# Patient Record
Sex: Female | Born: 1955
Health system: Southern US, Community
[De-identification: ages and names within clinical notes are randomized; demographics above are authoritative.]

## PROBLEM LIST (undated history)

## (undated) DIAGNOSIS — Z8719 Personal history of other diseases of the digestive system: Secondary | ICD-10-CM

## (undated) DIAGNOSIS — H3530 Unspecified macular degeneration: Secondary | ICD-10-CM

## (undated) DIAGNOSIS — Z780 Asymptomatic menopausal state: Secondary | ICD-10-CM

## (undated) DIAGNOSIS — J45909 Unspecified asthma, uncomplicated: Secondary | ICD-10-CM

## (undated) DIAGNOSIS — J302 Other seasonal allergic rhinitis: Secondary | ICD-10-CM

## (undated) DIAGNOSIS — H269 Unspecified cataract: Secondary | ICD-10-CM

## (undated) DIAGNOSIS — T7840XA Allergy, unspecified, initial encounter: Secondary | ICD-10-CM

## (undated) DIAGNOSIS — M1990 Unspecified osteoarthritis, unspecified site: Secondary | ICD-10-CM

## (undated) DIAGNOSIS — R7989 Other specified abnormal findings of blood chemistry: Secondary | ICD-10-CM

## (undated) DIAGNOSIS — R1011 Right upper quadrant pain: Secondary | ICD-10-CM

## (undated) DIAGNOSIS — E079 Disorder of thyroid, unspecified: Secondary | ICD-10-CM

## (undated) DIAGNOSIS — R000 Tachycardia, unspecified: Secondary | ICD-10-CM

## (undated) DIAGNOSIS — F419 Anxiety disorder, unspecified: Secondary | ICD-10-CM

## (undated) HISTORY — DX: Right upper quadrant pain: R10.11

## (undated) HISTORY — DX: Other specified abnormal findings of blood chemistry: R79.89

## (undated) HISTORY — DX: Unspecified osteoarthritis, unspecified site: M19.90

## (undated) HISTORY — DX: Unspecified macular degeneration: H35.30

## (undated) HISTORY — DX: Asymptomatic menopausal state: Z78.0

## (undated) HISTORY — DX: Disorder of thyroid, unspecified: E07.9

## (undated) HISTORY — DX: Tachycardia, unspecified: R00.0

## (undated) HISTORY — DX: Other seasonal allergic rhinitis: J30.2

## (undated) HISTORY — DX: Unspecified cataract: H26.9

## (undated) HISTORY — DX: Personal history of other diseases of the digestive system: Z87.19

## (undated) HISTORY — DX: Allergy, unspecified, initial encounter: T78.40XA

## (undated) HISTORY — DX: Anxiety disorder, unspecified: F41.9

## (undated) HISTORY — DX: Unspecified asthma, uncomplicated: J45.909

## (undated) HISTORY — PX: SKIN LESION EXCISION: SHX2412

## (undated) HISTORY — PX: COLONOSCOPY: SHX174

---

## 1979-04-20 HISTORY — PX: OTHER SURGICAL HISTORY: SHX169

## 1997-09-20 ENCOUNTER — Other Ambulatory Visit: Admission: RE | Admit: 1997-09-20 | Discharge: 1997-09-20 | Payer: Self-pay | Admitting: Gynecology

## 1997-10-01 ENCOUNTER — Emergency Department (HOSPITAL_COMMUNITY): Admission: EM | Admit: 1997-10-01 | Discharge: 1997-10-01 | Payer: Self-pay | Admitting: Emergency Medicine

## 1997-10-15 ENCOUNTER — Other Ambulatory Visit: Admission: RE | Admit: 1997-10-15 | Discharge: 1997-10-15 | Payer: Self-pay | Admitting: Gynecology

## 1998-04-19 HISTORY — PX: OTHER SURGICAL HISTORY: SHX169

## 1999-07-14 ENCOUNTER — Other Ambulatory Visit: Admission: RE | Admit: 1999-07-14 | Discharge: 1999-07-14 | Payer: Self-pay | Admitting: Gynecology

## 1999-10-27 ENCOUNTER — Other Ambulatory Visit: Admission: RE | Admit: 1999-10-27 | Discharge: 1999-10-27 | Payer: Self-pay | Admitting: Gynecology

## 1999-10-28 ENCOUNTER — Other Ambulatory Visit: Admission: RE | Admit: 1999-10-28 | Discharge: 1999-10-28 | Payer: Self-pay | Admitting: Gynecology

## 2000-01-09 ENCOUNTER — Emergency Department (HOSPITAL_COMMUNITY): Admission: EM | Admit: 2000-01-09 | Discharge: 2000-01-09 | Payer: Self-pay | Admitting: *Deleted

## 2000-08-01 ENCOUNTER — Other Ambulatory Visit: Admission: RE | Admit: 2000-08-01 | Discharge: 2000-08-01 | Payer: Self-pay | Admitting: Gynecology

## 2001-07-25 ENCOUNTER — Other Ambulatory Visit: Admission: RE | Admit: 2001-07-25 | Discharge: 2001-07-25 | Payer: Self-pay | Admitting: Gynecology

## 2002-02-13 ENCOUNTER — Other Ambulatory Visit: Admission: RE | Admit: 2002-02-13 | Discharge: 2002-02-13 | Payer: Self-pay | Admitting: Gynecology

## 2002-08-08 ENCOUNTER — Other Ambulatory Visit: Admission: RE | Admit: 2002-08-08 | Discharge: 2002-08-08 | Payer: Self-pay | Admitting: Gynecology

## 2003-08-12 ENCOUNTER — Other Ambulatory Visit: Admission: RE | Admit: 2003-08-12 | Discharge: 2003-08-12 | Payer: Self-pay | Admitting: Gynecology

## 2003-12-29 ENCOUNTER — Emergency Department (HOSPITAL_COMMUNITY): Admission: EM | Admit: 2003-12-29 | Discharge: 2003-12-30 | Payer: Self-pay | Admitting: Emergency Medicine

## 2004-09-01 ENCOUNTER — Other Ambulatory Visit: Admission: RE | Admit: 2004-09-01 | Discharge: 2004-09-01 | Payer: Self-pay | Admitting: Gynecology

## 2006-04-21 ENCOUNTER — Other Ambulatory Visit: Admission: RE | Admit: 2006-04-21 | Discharge: 2006-04-21 | Payer: Self-pay | Admitting: Gynecology

## 2006-09-09 ENCOUNTER — Ambulatory Visit: Payer: Self-pay | Admitting: Internal Medicine

## 2006-09-09 LAB — CONVERTED CEMR LAB
Alkaline Phosphatase: 81 units/L (ref 39–117)
BUN: 10 mg/dL (ref 6–23)
Calcium: 9.2 mg/dL (ref 8.4–10.5)
Chloride: 105 meq/L (ref 96–112)
Cholesterol: 217 mg/dL (ref 0–200)
Creatinine, Ser: 0.9 mg/dL (ref 0.4–1.2)
Direct LDL: 108.9 mg/dL
Free T4: 0.6 ng/dL (ref 0.6–1.6)
GFR calc Af Amer: 85 mL/min
GFR calc non Af Amer: 70 mL/min
Sodium: 141 meq/L (ref 135–145)
TSH: 2.96 microintl units/mL (ref 0.35–5.50)
Total Protein: 7.1 g/dL (ref 6.0–8.3)

## 2006-10-18 ENCOUNTER — Ambulatory Visit: Payer: Self-pay | Admitting: Internal Medicine

## 2006-11-01 ENCOUNTER — Encounter: Payer: Self-pay | Admitting: Physician Assistant

## 2006-11-01 ENCOUNTER — Ambulatory Visit: Payer: Self-pay | Admitting: Internal Medicine

## 2006-11-01 ENCOUNTER — Encounter: Payer: Self-pay | Admitting: Internal Medicine

## 2007-02-18 ENCOUNTER — Encounter: Payer: Self-pay | Admitting: Internal Medicine

## 2007-02-18 DIAGNOSIS — F411 Generalized anxiety disorder: Secondary | ICD-10-CM | POA: Insufficient documentation

## 2007-02-18 DIAGNOSIS — K589 Irritable bowel syndrome without diarrhea: Secondary | ICD-10-CM | POA: Insufficient documentation

## 2007-02-18 DIAGNOSIS — R519 Headache, unspecified: Secondary | ICD-10-CM | POA: Insufficient documentation

## 2007-02-18 DIAGNOSIS — K580 Irritable bowel syndrome with diarrhea: Secondary | ICD-10-CM | POA: Insufficient documentation

## 2007-02-18 DIAGNOSIS — J4520 Mild intermittent asthma, uncomplicated: Secondary | ICD-10-CM | POA: Insufficient documentation

## 2007-02-18 DIAGNOSIS — J301 Allergic rhinitis due to pollen: Secondary | ICD-10-CM | POA: Insufficient documentation

## 2007-02-18 DIAGNOSIS — J45909 Unspecified asthma, uncomplicated: Secondary | ICD-10-CM | POA: Insufficient documentation

## 2007-02-18 DIAGNOSIS — R51 Headache: Secondary | ICD-10-CM | POA: Insufficient documentation

## 2007-12-29 ENCOUNTER — Ambulatory Visit: Payer: Self-pay | Admitting: Internal Medicine

## 2007-12-29 DIAGNOSIS — R1013 Epigastric pain: Secondary | ICD-10-CM | POA: Insufficient documentation

## 2007-12-29 DIAGNOSIS — E042 Nontoxic multinodular goiter: Secondary | ICD-10-CM | POA: Insufficient documentation

## 2007-12-29 DIAGNOSIS — R6881 Early satiety: Secondary | ICD-10-CM | POA: Insufficient documentation

## 2007-12-29 LAB — CONVERTED CEMR LAB
ALT: 17 units/L (ref 0–35)
Albumin: 4.1 g/dL (ref 3.5–5.2)
Alkaline Phosphatase: 108 units/L (ref 39–117)
BUN: 14 mg/dL (ref 6–23)
Bilirubin, Direct: 0.1 mg/dL (ref 0.0–0.3)
CO2: 30 meq/L (ref 19–32)
Calcium: 9 mg/dL (ref 8.4–10.5)
Creatinine, Ser: 1 mg/dL (ref 0.4–1.2)
Crystals: NEGATIVE
Eosinophils Relative: 12.4 % — ABNORMAL HIGH (ref 0.0–5.0)
GFR calc non Af Amer: 62 mL/min
HCT: 36.1 % (ref 36.0–46.0)
Hemoglobin: 12.6 g/dL (ref 12.0–15.0)
Lymphocytes Relative: 21.5 % (ref 12.0–46.0)
MCHC: 35 g/dL (ref 30.0–36.0)
Monocytes Absolute: 0.6 10*3/uL (ref 0.1–1.0)
Monocytes Relative: 8.4 % (ref 3.0–12.0)
Neutro Abs: 3.7 10*3/uL (ref 1.4–7.7)
Nitrite: NEGATIVE
RBC / HPF: NONE SEEN
RBC: 4.16 M/uL (ref 3.87–5.11)
RDW: 12 % (ref 11.5–14.6)
Sodium: 139 meq/L (ref 135–145)
Total Bilirubin: 0.6 mg/dL (ref 0.3–1.2)
Total Protein, Urine: NEGATIVE mg/dL
Total Protein: 7 g/dL (ref 6.0–8.3)
WBC: 6.6 10*3/uL (ref 4.5–10.5)

## 2007-12-31 ENCOUNTER — Encounter: Payer: Self-pay | Admitting: Internal Medicine

## 2008-01-05 ENCOUNTER — Telehealth: Payer: Self-pay | Admitting: Internal Medicine

## 2008-01-08 ENCOUNTER — Ambulatory Visit: Payer: Self-pay | Admitting: Gastroenterology

## 2008-01-08 DIAGNOSIS — R110 Nausea: Secondary | ICD-10-CM | POA: Insufficient documentation

## 2008-01-08 DIAGNOSIS — K5730 Diverticulosis of large intestine without perforation or abscess without bleeding: Secondary | ICD-10-CM | POA: Insufficient documentation

## 2008-01-10 LAB — CONVERTED CEMR LAB
Amylase: 75 units/L (ref 27–131)
Basophils Absolute: 0.1 10*3/uL (ref 0.0–0.1)
Eosinophils Absolute: 0.8 10*3/uL — ABNORMAL HIGH (ref 0.0–0.7)
HCT: 37.6 % (ref 36.0–46.0)
MCHC: 34.9 g/dL (ref 30.0–36.0)
MCV: 86.9 fL (ref 78.0–100.0)
Neutro Abs: 3.2 10*3/uL (ref 1.4–7.7)
Platelets: 326 10*3/uL (ref 150–400)
RBC: 4.33 M/uL (ref 3.87–5.11)
RDW: 11.9 % (ref 11.5–14.6)
WBC: 6.2 10*3/uL (ref 4.5–10.5)

## 2008-01-12 ENCOUNTER — Ambulatory Visit (HOSPITAL_COMMUNITY): Admission: RE | Admit: 2008-01-12 | Discharge: 2008-01-12 | Payer: Self-pay | Admitting: Gastroenterology

## 2008-01-17 ENCOUNTER — Telehealth: Payer: Self-pay | Admitting: Internal Medicine

## 2008-01-18 ENCOUNTER — Telehealth: Payer: Self-pay | Admitting: Internal Medicine

## 2008-01-31 DIAGNOSIS — Z86010 Personal history of colonic polyps: Secondary | ICD-10-CM | POA: Insufficient documentation

## 2008-01-31 DIAGNOSIS — D126 Benign neoplasm of colon, unspecified: Secondary | ICD-10-CM | POA: Insufficient documentation

## 2008-02-01 ENCOUNTER — Ambulatory Visit: Payer: Self-pay | Admitting: Internal Medicine

## 2008-03-01 ENCOUNTER — Telehealth: Payer: Self-pay | Admitting: Internal Medicine

## 2008-03-04 ENCOUNTER — Telehealth (INDEPENDENT_AMBULATORY_CARE_PROVIDER_SITE_OTHER): Payer: Self-pay | Admitting: *Deleted

## 2008-03-13 ENCOUNTER — Ambulatory Visit: Payer: Self-pay | Admitting: Internal Medicine

## 2009-03-17 ENCOUNTER — Ambulatory Visit: Payer: Self-pay | Admitting: Internal Medicine

## 2009-03-17 DIAGNOSIS — J0100 Acute maxillary sinusitis, unspecified: Secondary | ICD-10-CM | POA: Insufficient documentation

## 2009-03-25 ENCOUNTER — Telehealth: Payer: Self-pay | Admitting: Internal Medicine

## 2009-03-27 ENCOUNTER — Ambulatory Visit: Payer: Self-pay | Admitting: Internal Medicine

## 2009-03-27 DIAGNOSIS — J309 Allergic rhinitis, unspecified: Secondary | ICD-10-CM | POA: Insufficient documentation

## 2009-03-27 LAB — CONVERTED CEMR LAB
Basophils Relative: 0.8 % (ref 0.0–3.0)
HCT: 38.2 % (ref 36.0–46.0)
Monocytes Relative: 6.4 % (ref 3.0–12.0)
Neutrophils Relative %: 48.5 % (ref 43.0–77.0)
WBC: 7.1 10*3/uL (ref 4.5–10.5)

## 2009-03-30 ENCOUNTER — Telehealth (INDEPENDENT_AMBULATORY_CARE_PROVIDER_SITE_OTHER): Payer: Self-pay | Admitting: *Deleted

## 2009-08-09 ENCOUNTER — Emergency Department (HOSPITAL_COMMUNITY): Admission: EM | Admit: 2009-08-09 | Discharge: 2009-08-09 | Payer: Self-pay | Admitting: Emergency Medicine
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
## 2010-09-03 ENCOUNTER — Other Ambulatory Visit: Payer: Self-pay | Admitting: Internal Medicine

## 2010-09-03 ENCOUNTER — Other Ambulatory Visit: Payer: Self-pay

## 2010-09-03 DIAGNOSIS — Z0000 Encounter for general adult medical examination without abnormal findings: Secondary | ICD-10-CM

## 2010-09-07 ENCOUNTER — Encounter: Payer: Self-pay | Admitting: Internal Medicine

## 2010-09-10 ENCOUNTER — Encounter: Payer: Self-pay | Admitting: Internal Medicine

## 2010-09-29 ENCOUNTER — Other Ambulatory Visit (INDEPENDENT_AMBULATORY_CARE_PROVIDER_SITE_OTHER): Payer: Self-pay

## 2010-09-29 DIAGNOSIS — Z0000 Encounter for general adult medical examination without abnormal findings: Secondary | ICD-10-CM

## 2010-09-29 LAB — CBC WITH DIFFERENTIAL/PLATELET
Basophils Absolute: 0 10*3/uL (ref 0.0–0.1)
Eosinophils Absolute: 0.4 10*3/uL (ref 0.0–0.7)
Eosinophils Relative: 9.3 % — ABNORMAL HIGH (ref 0.0–5.0)
Hemoglobin: 12.8 g/dL (ref 12.0–15.0)
Lymphocytes Relative: 33.7 % (ref 12.0–46.0)
Lymphs Abs: 1.5 10*3/uL (ref 0.7–4.0)
Monocytes Relative: 8.6 % (ref 3.0–12.0)
Neutrophils Relative %: 47.7 % (ref 43.0–77.0)
Platelets: 280 10*3/uL (ref 150.0–400.0)
RBC: 4.2 Mil/uL (ref 3.87–5.11)

## 2010-09-29 LAB — URINALYSIS
Leukocytes, UA: NEGATIVE
Specific Gravity, Urine: 1.01 (ref 1.000–1.030)
Total Protein, Urine: NEGATIVE
Urine Glucose: NEGATIVE

## 2010-09-29 LAB — BASIC METABOLIC PANEL
BUN: 15 mg/dL (ref 6–23)
CO2: 27 mEq/L (ref 19–32)
GFR: 69.68 mL/min (ref >60.00)
Potassium: 4.6 mEq/L (ref 3.5–5.1)

## 2010-09-29 LAB — HEPATIC FUNCTION PANEL
ALT: 14 U/L (ref 0–35)
AST: 10 U/L (ref 0–37)
Total Protein: 7.6 g/dL (ref 6.0–8.3)

## 2010-09-29 LAB — LIPID PANEL
Triglycerides: 52 mg/dL (ref 0.0–149.0)
VLDL: 10.4 mg/dL (ref 0.0–40.0)

## 2010-09-30 ENCOUNTER — Encounter: Payer: Self-pay | Admitting: Internal Medicine

## 2010-10-01 ENCOUNTER — Encounter: Payer: Self-pay | Admitting: Internal Medicine

## 2010-10-01 ENCOUNTER — Ambulatory Visit (INDEPENDENT_AMBULATORY_CARE_PROVIDER_SITE_OTHER): Payer: BC Managed Care – PPO | Admitting: Internal Medicine

## 2010-10-01 VITALS — BP 108/64 | HR 62 | Temp 97.7°F | Ht 65.0 in | Wt 146.0 lb

## 2010-10-01 DIAGNOSIS — Z136 Encounter for screening for cardiovascular disorders: Secondary | ICD-10-CM

## 2010-10-01 DIAGNOSIS — E039 Hypothyroidism, unspecified: Secondary | ICD-10-CM

## 2010-10-01 DIAGNOSIS — Z0000 Encounter for general adult medical examination without abnormal findings: Secondary | ICD-10-CM | POA: Insufficient documentation

## 2010-10-01 DIAGNOSIS — F411 Generalized anxiety disorder: Secondary | ICD-10-CM

## 2010-10-01 NOTE — Assessment & Plan Note (Signed)
She has been off cymbalta for more than a year and is doing fine. She will still have occasional episodes of mild anxiety but she is able to manage this without medication. STRONG WORK.
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
## 2010-10-01 NOTE — Assessment & Plan Note (Signed)
Interval history is unremarkable with no medical, surgical or traumatic events. Physical exam is normal sans gyn and breast exam. Lab results are in normal range. She is current with mammography, colorectal cancer screening. She is current with immunizations. 12 Lead EKG is normal without signs of injury or ischemia.  In summary - a delightful woman who is medically stable. She is advised to return for a general physical exam and lab in 3 years. She will otherwise return as needed.

## 2010-10-01 NOTE — Assessment & Plan Note (Signed)
TSH is in normal range and patient takes no medication.   Plan - no further work-up           Check TSH at next routine physical exam.

## 2010-10-05 ENCOUNTER — Encounter: Payer: Self-pay | Admitting: Internal Medicine

## 2011-03-04 ENCOUNTER — Encounter: Payer: Self-pay | Admitting: Internal Medicine

## 2011-03-04 ENCOUNTER — Ambulatory Visit (INDEPENDENT_AMBULATORY_CARE_PROVIDER_SITE_OTHER): Payer: BC Managed Care – PPO | Admitting: Internal Medicine

## 2011-03-04 VITALS — BP 110/70 | HR 75 | Temp 98.5°F | Ht 65.0 in | Wt 145.0 lb

## 2011-03-04 DIAGNOSIS — J019 Acute sinusitis, unspecified: Secondary | ICD-10-CM | POA: Insufficient documentation

## 2011-03-04 MED ORDER — AZITHROMYCIN 250 MG PO TABS: ORAL_TABLET | ORAL | Status: AC

## 2011-03-04 NOTE — Assessment & Plan Note (Signed)
Mild to mod, for antibx course,  to f/u any worsening symptoms or concerns 

## 2011-03-04 NOTE — Progress Notes (Signed)
  Subjective:    Patient ID: Janice Vincent, female    DOB: Aug 27, 1955, 55 y.o.   MRN: [id number]  HPI   Here with 3 days acute onset fever, facial pain, pressure, general weakness and malaise, and greenish d/c, with slight ST, but little to no cough and Pt denies chest pain, increased sob or doe, wheezing, orthopnea, PND, increased LE swelling, palpitations, dizziness or syncope., but also with bilateral earache as well.   Past Medical History  Diagnosis Date  . Elevated TSH   . Postmenopausal   . Right upper quadrant pain     due to IBS  . Childhood asthma   . Osteoporosis   . Anxiety   . DJD (degenerative joint disease)     right thumb   Past Surgical History  Procedure Date  . Breast biospy   . Skin lesion excision   . Left foot   . Growth removed from left foot 5th digit     reports that she has never smoked. She has never used smokeless tobacco. She reports that she does not drink alcohol or use illicit drugs. family history is not on file.  She is adopted. Allergies  Allergen Reactions  . Sulfonamide Derivatives    Current Outpatient Prescriptions on File Prior to Visit  Medication Sig Dispense Refill  . Calcium-Magnesium-Zinc 500-250-12.5 MG TABS Take by mouth.        . vitamin C (ASCORBIC ACID) 500 MG tablet Take 500 mg by mouth daily.        . cetirizine (ZYRTEC) 10 MG tablet Take 10 mg by mouth daily.        . fluticasone (FLONASE) 50 MCG/ACT nasal spray Place 1 spray into the nose daily.        . Glucosamine-Chondroitin 1500-1200 MG/30ML LIQD Take by mouth. 5-10 ml by mouth QID as needed for cough and congestion        Review of Systems All otherwise neg per pt     Objective:   Physical Exam BP 110/70  Pulse 75  Temp(Src) 98.5 F (36.9 C) (Oral)  Ht 5\' 5"  (1.651 m)  Wt 145 lb (65.772 kg)  BMI 24.13 kg/m2  SpO2 98% Physical Exam  VS noted, mild ill Constitutional: Pt appears well-developed and well-nourished.  HENT: Head: Normocephalic.  Right Ear:  External ear normal.  Left Ear: External ear normal.  Bilat tm's mild erythema.  Sinus tender bilat.  Pharynx mild erythema Eyes: Conjunctivae and EOM are normal. Pupils are equal, round, and reactive to light.  Neck: Normal range of motion. Neck supple.  Cardiovascular: Normal rate and regular rhythm.   Pulmonary/Chest: Effort normal and breath sounds normal.  Neurological: Pt is alert. No cranial nerve deficit.  Skin: Skin is warm. No erythema.  Psychiatric: Pt behavior is normal. Thought content normal.     Assessment & Plan:

## 2011-03-04 NOTE — Patient Instructions (Signed)
Take all new medications as prescribed Continue all other medications as before You can also take Delsym OTC for cough, and/or Mucinex (or it's generic off brand) for congestion  

## 2011-06-11 ENCOUNTER — Emergency Department (HOSPITAL_COMMUNITY)
Admission: EM | Admit: 2011-06-11 | Discharge: 2011-06-12 | Disposition: A | Discharge status: Home / Self Care | Payer: BC Managed Care – PPO | Attending: Emergency Medicine | Admitting: Emergency Medicine

## 2011-06-11 ENCOUNTER — Emergency Department (HOSPITAL_COMMUNITY): Payer: BC Managed Care – PPO

## 2011-06-11 ENCOUNTER — Other Ambulatory Visit: Payer: Self-pay

## 2011-06-11 ENCOUNTER — Encounter (HOSPITAL_COMMUNITY): Payer: Self-pay | Admitting: Emergency Medicine

## 2011-06-11 DIAGNOSIS — R10819 Abdominal tenderness, unspecified site: Secondary | ICD-10-CM | POA: Insufficient documentation

## 2011-06-11 DIAGNOSIS — M549 Dorsalgia, unspecified: Secondary | ICD-10-CM | POA: Insufficient documentation

## 2011-06-11 DIAGNOSIS — M810 Age-related osteoporosis without current pathological fracture: Secondary | ICD-10-CM | POA: Insufficient documentation

## 2011-06-11 DIAGNOSIS — F411 Generalized anxiety disorder: Secondary | ICD-10-CM | POA: Insufficient documentation

## 2011-06-11 DIAGNOSIS — R1013 Epigastric pain: Secondary | ICD-10-CM | POA: Insufficient documentation

## 2011-06-11 DIAGNOSIS — Z79899 Other long term (current) drug therapy: Secondary | ICD-10-CM | POA: Insufficient documentation

## 2011-06-11 DIAGNOSIS — M19049 Primary osteoarthritis, unspecified hand: Secondary | ICD-10-CM | POA: Insufficient documentation

## 2011-06-11 DIAGNOSIS — K2970 Gastritis, unspecified, without bleeding: Secondary | ICD-10-CM

## 2011-06-11 DIAGNOSIS — Z7982 Long term (current) use of aspirin: Secondary | ICD-10-CM | POA: Insufficient documentation

## 2011-06-11 DIAGNOSIS — E049 Nontoxic goiter, unspecified: Secondary | ICD-10-CM

## 2011-06-11 LAB — HEPATIC FUNCTION PANEL
AST: 45 U/L — ABNORMAL HIGH (ref 0–37)
Bilirubin, Direct: 0.2 mg/dL (ref 0.0–0.3)

## 2011-06-11 LAB — CBC
HCT: 35 % — ABNORMAL LOW (ref 36.0–46.0)
Hemoglobin: 12.1 g/dL (ref 12.0–15.0)
MCH: 29.3 pg (ref 26.0–34.0)
Platelets: 244 10*3/uL (ref 150–400)
RDW: 12.7 % (ref 11.5–15.5)
WBC: 10.5 10*3/uL (ref 4.0–10.5)

## 2011-06-11 LAB — POCT I-STAT TROPONIN I: Troponin i, poc: 0 ng/mL (ref 0.00–0.08)

## 2011-06-11 LAB — DIFFERENTIAL
Basophils Relative: 0 % (ref 0–1)
Lymphocytes Relative: 15 % (ref 12–46)
Lymphs Abs: 1.5 10*3/uL (ref 0.7–4.0)
Neutro Abs: 8.1 10*3/uL — ABNORMAL HIGH (ref 1.7–7.7)

## 2011-06-11 LAB — POCT I-STAT, CHEM 8
Chloride: 103 mEq/L (ref 96–112)
Glucose, Bld: 153 mg/dL — ABNORMAL HIGH (ref 70–99)
HCT: 35 % — ABNORMAL LOW (ref 36.0–46.0)
Potassium: 3.1 mEq/L — ABNORMAL LOW (ref 3.5–5.1)

## 2011-06-11 LAB — LIPASE, BLOOD: Lipase: 48 U/L (ref 11–59)

## 2011-06-11 MED ORDER — FENTANYL CITRATE 0.05 MG/ML IJ SOLN
100.0000 ug | Freq: Once | INTRAMUSCULAR | Status: AC
  Administered 2011-06-11: 100 ug via INTRAVENOUS
  Filled 2011-06-11: qty 2

## 2011-06-11 MED ORDER — SODIUM CHLORIDE 0.9 % IV BOLUS (SEPSIS)
1000.0000 mL | Freq: Once | INTRAVENOUS | Status: AC
  Administered 2011-06-11: 1000 mL via INTRAVENOUS

## 2011-06-11 MED ORDER — ONDANSETRON HCL 4 MG/2ML IJ SOLN
INTRAMUSCULAR | Status: AC
  Administered 2011-06-11: 23:00:00
  Filled 2011-06-11: qty 2

## 2011-06-11 NOTE — ED Provider Notes (Signed)
History     CSN: [id number]  Arrival date & time 06/11/11  2240   First MD Initiated Contact with Patient 06/11/11 2309      Chief Complaint  Patient presents with  . Chest Pain    epigastric    (Consider location/radiation/quality/duration/timing/severity/associated sxs/prior treatment) Patient is a 56 y.o. female presenting with abdominal pain. The history is provided by the patient.  Abdominal Pain The primary symptoms of the illness include abdominal pain. The primary symptoms of the illness do not include fever, fatigue, shortness of breath, nausea, vomiting, diarrhea, hematemesis or hematochezia. The current episode started 1 to 2 hours ago. The onset of the illness was sudden. The problem has not changed since onset. The illness is associated with awakening from sleep. The patient has not had a change in bowel habit. Risk factors for an acute abdominal problem include being elderly. Additional symptoms associated with the illness include back pain. Symptoms associated with the illness do not include chills, anorexia, diaphoresis, heartburn, constipation, urgency or frequency. Significant associated medical issues do not include diabetes or HIV.    Past Medical History  Diagnosis Date  . Elevated TSH   . Postmenopausal   . Right upper quadrant pain     due to IBS  . Childhood asthma   . Osteoporosis   . Anxiety   . DJD (degenerative joint disease)     right thumb    Past Surgical History  Procedure Date  . Breast biospy   . Skin lesion excision   . Left foot   . Growth removed from left foot 5th digit     Family History  Problem Relation Age of Onset  . Adopted: Yes    History  Substance Use Topics  . Smoking status: Never Smoker   . Smokeless tobacco: Never Used  . Alcohol Use: No    OB History    Grav Para Term Preterm Abortions TAB SAB Ect Mult Living                  Review of Systems  Constitutional: Negative for fever, chills, diaphoresis and  fatigue.  HENT: Negative for facial swelling.   Eyes: Negative.   Respiratory: Negative for shortness of breath.   Cardiovascular: Negative for chest pain.  Gastrointestinal: Positive for abdominal pain. Negative for heartburn, nausea, vomiting, diarrhea, constipation, hematochezia, abdominal distention, anorexia and hematemesis.  Genitourinary: Negative for urgency and frequency.  Musculoskeletal: Positive for back pain.  Skin: Negative.   Neurological: Negative.   Hematological: Negative.   Psychiatric/Behavioral: Negative.     Allergies  Sulfonamide derivatives  Home Medications   Current Outpatient Rx  Name Route Sig Dispense Refill  . ASPIRIN EC 81 MG PO TBEC Oral Take 81 mg by mouth once. Chest pain    . CALCIUM CARBONATE 1250 MG PO TABS Oral Take 1 tablet by mouth daily.    Marland Kitchen CALCIUM CARBONATE ANTACID 500 MG PO CHEW Oral Chew 2 tablets by mouth once. indigestion    . GLUCOSAMINE-CHONDROITIN 1500-1200 MG/30ML PO LIQD Oral Take by mouth. 5-10 ml by mouth QID as needed for cough and congestion     . VITAMIN C 500 MG PO TABS Oral Take 500 mg by mouth daily.      Marland Kitchen CETIRIZINE HCL 10 MG PO TABS Oral Take 10 mg by mouth daily as needed. Seasonal allergies    . FLUTICASONE PROPIONATE 50 MCG/ACT NA SUSP Nasal Place 1 spray into the nose daily as needed. For seasonal  allergies    . NITROGLYCERIN 0.4 MG SL SUBL Sublingual Place 0.4 mg under the tongue once. x1 dose    . ZOFRAN IV Intravenous Inject 4 mg into the vein once.      BP 87/37  Pulse 52  Temp(Src) 97.6 F (36.4 C) (Oral)  Resp 14  SpO2 100%  Physical Exam  Constitutional: She is oriented to person, place, and time. She appears well-developed and well-nourished.  HENT:  Head: Normocephalic and atraumatic.  Mouth/Throat: Oropharynx is clear and moist.  Eyes: Conjunctivae are normal. Pupils are equal, round, and reactive to light.  Neck: Normal range of motion. Neck supple.  Cardiovascular: Normal rate and regular  rhythm.   Pulmonary/Chest: Effort normal and breath sounds normal. She has no wheezes. She has no rales.  Abdominal: Soft. Bowel sounds are decreased. There is tenderness in the right upper quadrant, epigastric area and left upper quadrant.  Musculoskeletal: Normal range of motion.  Neurological: She is alert and oriented to person, place, and time.  Skin: Skin is warm and dry. She is not diaphoretic.  Psychiatric: Thought content normal.    ED Course  Procedures (including critical care time)  Labs Reviewed  CBC - Abnormal; Notable for the following:    HCT 35.0 (*)    All other components within normal limits  DIFFERENTIAL - Abnormal; Notable for the following:    Neutro Abs 8.1 (*)    All other components within normal limits  POCT I-STAT, CHEM 8 - Abnormal; Notable for the following:    Potassium 3.1 (*)    Glucose, Bld 153 (*)    Hemoglobin 11.9 (*)    HCT 35.0 (*)    All other components within normal limits  POCT I-STAT TROPONIN I  LIPASE, BLOOD  HEPATIC FUNCTION PANEL   No results found.   No diagnosis found.    MDM   Date: 06/11/2011  Rate: 55  Rhythm: sinus bradycardia  QRS Axis: normal  Intervals: normal  ST/T Wave abnormalities: nonspecific ST changes  Conduction Disutrbances:none  Narrative Interpretation:   Old EKG Reviewed: none available   Suspect ulcer.  Will refer to GI and start prilosec.  Patient given instructions for bland diet and verbalizes understanding Patient informed of goiter and need for follow up thyroid US and must tell PMD to schedule.  Return for worsening abdominal pain, chest pain shortness of breath or any concerns.      Jasmine Awe, MD 06/12/11 [phone]

## 2011-06-11 NOTE — ED Notes (Signed)
Per EMS; pt ate, and then laid down to take nap; woke up with severe stabbing/tight  Epigastric pain; pt started vomiting after EMS arrived; NSR, with PACs, 60's; 1 nitro given no relief of pain, 4mg  zofran given, 18g RAC, 2L [place]; pt took 324mg  asa prior to EMS arrival; BP 110/90, HR 50-80; spo2 100% RR 18;

## 2011-06-12 ENCOUNTER — Encounter (HOSPITAL_COMMUNITY): Payer: Self-pay | Admitting: Radiology

## 2011-06-12 ENCOUNTER — Emergency Department (HOSPITAL_COMMUNITY): Payer: BC Managed Care – PPO

## 2011-06-12 LAB — POCT I-STAT TROPONIN I: Troponin i, poc: 0 ng/mL (ref 0.00–0.08)

## 2011-06-12 MED ORDER — FENTANYL CITRATE 0.05 MG/ML IJ SOLN
100.0000 ug | Freq: Once | INTRAMUSCULAR | Status: AC
  Administered 2011-06-12: 100 ug via INTRAVENOUS
  Filled 2011-06-12: qty 2

## 2011-06-12 MED ORDER — IOHEXOL 350 MG/ML SOLN
100.0000 mL | Freq: Once | INTRAVENOUS | Status: AC | PRN
  Administered 2011-06-12: 100 mL via INTRAVENOUS

## 2011-06-12 MED ORDER — ONDANSETRON HCL 4 MG/2ML IJ SOLN
4.0000 mg | Freq: Once | INTRAMUSCULAR | Status: AC
  Administered 2011-06-12: 4 mg via INTRAVENOUS
  Filled 2011-06-12: qty 2

## 2011-06-12 MED ORDER — GI COCKTAIL ~~LOC~~
30.0000 mL | Freq: Once | ORAL | Status: AC
  Administered 2011-06-12: 30 mL via ORAL
  Filled 2011-06-12: qty 30

## 2011-06-12 MED ORDER — OMEPRAZOLE 20 MG PO CPDR: 20.0000 mg | DELAYED_RELEASE_CAPSULE | Freq: Every day | ORAL | Status: DC

## 2011-06-12 NOTE — Discharge Instructions (Signed)
Gastritis Gastritis is an irritation of the stomach. This is often caused by medications, but can be from anything that bothers the stomach. Other stomach irritants are:  Alcohol.   Caffeine.   Nicotine.   Spicy or acid foods.   Medications for pain and arthritis. Aspirin and other anti-inflammatory medicines such as ibuprofen (Advil), naproxen (Aleve), and ketoprofen (Orudis) can be highly irritating.   Emotional distress.  Symptoms of gastritis may include:  Abdominal pain.   Indigestion.   Nausea and or vomiting.   Bleeding.  Some patients with chronic gastritis and ulcers have been infected by a germ. They may need special testing. Medications which kill germs can be used to cure this condition. Treatment includes avoiding the substances mentioned above that are known to cause stomach trouble. Medications used to treat gastritis can include:  Antacids.   Medicines to control vomiting.   Acid blocking medicines.  Symptoms of gastritis usually improve within 2-3 days of starting treatment. Call your caregiver if you are not better in a few days. SEEK MEDICAL CARE IF:   You have increased stomach or chest pain.   You vomit blood.   You faint or feel lightheaded.   You cannot keep fluids down.   You pass bloody or black stools.   You develop severe back pain.  MAKE SURE YOU:   Understand these instructions.   Will watch your condition.   Will get help right away if you are not doing well or get worse.  Document Released: 04/05/2005 Document Revised: 12/16/2010 Document Reviewed: 09/21/2006 [facility] Patient Information 2012 [place], [place].

## 2011-06-14 ENCOUNTER — Ambulatory Visit (INDEPENDENT_AMBULATORY_CARE_PROVIDER_SITE_OTHER): Payer: BC Managed Care – PPO | Admitting: Internal Medicine

## 2011-06-14 ENCOUNTER — Encounter: Payer: Self-pay | Admitting: Internal Medicine

## 2011-06-14 VITALS — BP 112/78 | HR 65 | Temp 98.1°F | Resp 14 | Wt 145.0 lb

## 2011-06-14 DIAGNOSIS — E039 Hypothyroidism, unspecified: Secondary | ICD-10-CM

## 2011-06-14 DIAGNOSIS — R1011 Right upper quadrant pain: Secondary | ICD-10-CM | POA: Insufficient documentation

## 2011-06-14 NOTE — Assessment & Plan Note (Signed)
Patient with incidental finding of thyroid nodules c/w multi-nodular goiter. No symptoms, Normal TSH readings. No findings on physical exam  Plan - thyroid u/s

## 2011-06-14 NOTE — Assessment & Plan Note (Signed)
Middle age, softigue female with fatty food intolerance, light colored malodorous and bouyant stools with acute RUQ tenderness to palpation and percussion. Also, tenderness across the epigastrium.  CT angio abdomen was unremarkable. However, her symptoms clinically suggest gallbladder disease.  Plan - GB u/s ASAP            If abnormal will consider amylase and lipase.            If abnormal - GS consultation.            If normal - hepato-biliary scan.
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
## 2011-06-17 ENCOUNTER — Ambulatory Visit
Admission: RE | Admit: 2011-06-17 | Discharge: 2011-06-17 | Disposition: A | Discharge status: Home / Self Care | Payer: BC Managed Care – PPO | Source: Ambulatory Visit | Attending: Internal Medicine | Admitting: Internal Medicine

## 2011-06-17 DIAGNOSIS — E039 Hypothyroidism, unspecified: Secondary | ICD-10-CM

## 2011-06-17 DIAGNOSIS — R1011 Right upper quadrant pain: Secondary | ICD-10-CM

## 2011-06-18 ENCOUNTER — Telehealth: Payer: Self-pay | Admitting: *Deleted

## 2011-06-18 NOTE — Telephone Encounter (Signed)
Pt is calling requesting results of both Ultrasounds done yesterday. MEN pt-please advise.

## 2011-06-18 NOTE — Telephone Encounter (Signed)
1. Goiter 2. Gallstones  OV w/Dr Norins next wk  Thx

## 2011-06-20 ENCOUNTER — Encounter: Payer: Self-pay | Admitting: Internal Medicine

## 2011-06-21 ENCOUNTER — Encounter: Payer: Self-pay | Admitting: Internal Medicine

## 2011-06-21 ENCOUNTER — Other Ambulatory Visit: Payer: Self-pay | Admitting: Internal Medicine

## 2011-06-21 DIAGNOSIS — E042 Nontoxic multinodular goiter: Secondary | ICD-10-CM

## 2011-06-21 NOTE — Telephone Encounter (Signed)
Left mess for patient to call back.  

## 2011-06-21 NOTE — Telephone Encounter (Signed)
Pt informed of below. She states she just had an OV with Norins and wants his advisement as to what steps are needed to be taken next. She requests a call back from him or his nurse.

## 2011-06-25 ENCOUNTER — Telehealth: Payer: Self-pay | Admitting: Internal Medicine

## 2011-06-25 DIAGNOSIS — E042 Nontoxic multinodular goiter: Secondary | ICD-10-CM

## 2011-06-25 NOTE — Telephone Encounter (Signed)
I Checked and I entered an order for a thyroid uptake scan on March 4th. It is an imaging code. Is there some special way I am supposed to enter imaging orders so that they are picked up by PCCs. Please NOTE there is an order - please go ahead an process that order. If you cannot please let me know. It is less than good service for [place] to have a 1 week delay do due system issues.  Oh so frustrating.

## 2011-06-25 NOTE — Telephone Encounter (Signed)
Pt called to have her uptake and scan done there is no order in the epic need order to schedule

## 2011-07-06 ENCOUNTER — Encounter (HOSPITAL_COMMUNITY)
Admission: RE | Admit: 2011-07-06 | Discharge: 2011-07-06 | Disposition: A | Discharge status: Home / Self Care | Payer: BC Managed Care – PPO | Source: Ambulatory Visit | Attending: Internal Medicine | Admitting: Internal Medicine

## 2011-07-06 DIAGNOSIS — E042 Nontoxic multinodular goiter: Secondary | ICD-10-CM | POA: Insufficient documentation

## 2011-07-07 ENCOUNTER — Encounter (HOSPITAL_COMMUNITY)
Admission: RE | Admit: 2011-07-07 | Discharge: 2011-07-07 | Disposition: A | Discharge status: Home / Self Care | Payer: BC Managed Care – PPO | Source: Ambulatory Visit | Attending: Internal Medicine | Admitting: Internal Medicine

## 2011-07-07 MED ORDER — SODIUM PERTECHNETATE TC 99M INJECTION
10.5000 | Freq: Once | INTRAVENOUS | Status: AC | PRN
  Administered 2011-07-07: 11 via INTRAVENOUS

## 2011-07-07 MED ORDER — SODIUM IODIDE I 131 CAPSULE
11.0000 | Freq: Once | INTRAVENOUS | Status: AC | PRN
  Administered 2011-07-07: 11 via ORAL

## 2011-07-11 ENCOUNTER — Encounter: Payer: Self-pay | Admitting: Internal Medicine

## 2011-12-03 ENCOUNTER — Encounter: Payer: Self-pay | Admitting: Internal Medicine

## 2011-12-07 ENCOUNTER — Telehealth: Payer: Self-pay | Admitting: Internal Medicine

## 2011-12-07 NOTE — Telephone Encounter (Signed)
Forward 3 pages from [facility] to Dr. Illene Regulus for review on 12-07-11 ym

## 2012-07-27 ENCOUNTER — Encounter: Payer: Self-pay | Admitting: Internal Medicine

## 2013-03-27 ENCOUNTER — Encounter: Payer: Self-pay | Admitting: Internal Medicine

## 2013-04-18 ENCOUNTER — Ambulatory Visit (AMBULATORY_SURGERY_CENTER): Payer: Self-pay | Admitting: *Deleted

## 2013-04-18 VITALS — Ht 65.0 in | Wt 139.4 lb

## 2013-04-18 DIAGNOSIS — Z86010 Personal history of colonic polyps: Secondary | ICD-10-CM

## 2013-04-18 MED ORDER — MOVIPREP 100 G PO SOLR: ORAL | Status: DC

## 2013-04-18 NOTE — Progress Notes (Signed)
No allergies to eggs or soy. No problems with anesthesia.  

## 2013-04-23 ENCOUNTER — Encounter: Payer: Self-pay | Admitting: Internal Medicine

## 2013-04-30 ENCOUNTER — Encounter: Payer: BC Managed Care – PPO | Admitting: Internal Medicine

## 2013-05-07 ENCOUNTER — Encounter: Payer: Self-pay | Admitting: Internal Medicine

## 2013-05-07 ENCOUNTER — Ambulatory Visit (AMBULATORY_SURGERY_CENTER): Payer: BC Managed Care – PPO | Admitting: Internal Medicine

## 2013-05-07 VITALS — BP 111/67 | HR 64 | Temp 97.7°F | Resp 16 | Ht 65.0 in | Wt 139.0 lb

## 2013-05-07 DIAGNOSIS — Z86010 Personal history of colonic polyps: Secondary | ICD-10-CM

## 2013-05-07 DIAGNOSIS — Z1211 Encounter for screening for malignant neoplasm of colon: Secondary | ICD-10-CM

## 2013-05-07 DIAGNOSIS — D126 Benign neoplasm of colon, unspecified: Secondary | ICD-10-CM

## 2013-05-07 MED ORDER — SODIUM CHLORIDE 0.9 % IV SOLN: 500.0000 mL | INTRAVENOUS | Status: DC

## 2013-05-07 NOTE — Patient Instructions (Signed)
YOU HAD AN ENDOSCOPIC PROCEDURE TODAY AT THE [facility]: Refer to the procedure report that was given to you for any specific questions about what was found during the examination.  If the procedure report does not answer your questions, please call your gastroenterologist to clarify.  If you requested that your care partner not be given the details of your procedure findings, then the procedure report has been included in a sealed envelope for you to review at your convenience later.  YOU SHOULD EXPECT: Some feelings of bloating in the abdomen. Passage of more gas than usual.  Walking can help get rid of the air that was put into your GI tract during the procedure and reduce the bloating. If you had a lower endoscopy (such as a colonoscopy or flexible sigmoidoscopy) you may notice spotting of blood in your stool or on the toilet paper. If you underwent a bowel prep for your procedure, then you may not have a normal bowel movement for a few days.  DIET: Your first meal following the procedure should be a light meal and then it is ok to progress to your normal diet.  A half-sandwich or bowl of soup is an example of a good first meal.  Heavy or fried foods are harder to digest and may make you feel nauseous or bloated.  Likewise meals heavy in dairy and vegetables can cause extra gas to form and this can also increase the bloating.  Drink plenty of fluids but you should avoid alcoholic beverages for 24 hours.  ACTIVITY: Your care partner should take you home directly after the procedure.  You should plan to take it easy, moving slowly for the rest of the day.  You can resume normal activity the day after the procedure however you should NOT DRIVE or use heavy machinery for 24 hours (because of the sedation medicines used during the test).    SYMPTOMS TO REPORT IMMEDIATELY: A gastroenterologist can be reached at any hour.  During normal business hours, 8:30 AM to 5:00 PM Monday through Friday,  call [phone].  After hours and on weekends, please call the GI answering service at [phone] who will take a message and have the physician on call contact you.   Following lower endoscopy (colonoscopy or flexible sigmoidoscopy):  Excessive amounts of blood in the stool  Significant tenderness or worsening of abdominal pains  Swelling of the abdomen that is new, acute  Fever of 100F or higher   FOLLOW UP: If any biopsies were taken you will be contacted by phone or by letter within the next 1-3 weeks.  Call your gastroenterologist if you have not heard about the biopsies in 3 weeks.  Our staff will call the home number listed on your records the next business day following your procedure to check on you and address any questions or concerns that you may have at that time regarding the information given to you following your procedure. This is a courtesy call and so if there is no answer at the home number and we have not heard from you through the emergency physician on call, we will assume that you have returned to your regular daily activities without incident.  SIGNATURES/CONFIDENTIALITY: You and/or your care partner have signed paperwork which will be entered into your electronic medical record.  These signatures attest to the fact that that the information above on your After Visit Summary has been reviewed and is understood.  Full responsibility of the confidentiality of   this discharge information lies with you and/or your care-partner.   Information on diverticulosis & polyps given to you today

## 2013-05-07 NOTE — Op Note (Signed)
[place]  [address] [place], [id number]   COLONOSCOPY PROCEDURE REPORT  PATIENT: Janice Vincent, Janice Vincent  MR#: [id number] BIRTHDATE: [birth year] , 5  yrs. old GENDER: Female ENDOSCOPIST: Eustace Quail, MD REFERRED ID:[id number] Program Recall PROCEDURE DATE:  05/07/2013 PROCEDURE:   Colonoscopy with snare polypectomy  x1 First Screening Colonoscopy - Avg.  risk and is 50 yrs.  old or older - No.  Prior Negative Screening - Now for repeat screening. N/A  History of Adenoma - Now for follow-up colonoscopy & has been > or = to 3 yrs.  Yes hx of adenoma.  Has been 3 or more years since last colonoscopy.  Polyps Removed Today? Yes. ASA CLASS:   Class II INDICATIONS:Patient's personal history of adenomatous colon polyps. Index exam July 2008 (small tubular adenoma) MEDICATIONS: MAC sedation, administered by CRNA and propofol (Diprivan) 350mg  IV  DESCRIPTION OF PROCEDURE:   After the risks benefits and alternatives of the procedure were thoroughly explained, informed consent was obtained.  A digital rectal exam revealed no abnormalities of the rectum.   The LB [id number]  endoscope was introduced through the anus and advanced to the cecum, which was identified by both the appendix and ileocecal valve. No adverse events experienced.   The quality of the prep was excellent, using MoviPrep  The instrument was then slowly withdrawn as the colon was fully examined.  COLON FINDINGS: A diminutive sessile polyp was found in the ascending colon.  A polypectomy was performed with a cold snare. The resection was complete and the polyp tissue was completely retrieved.   Mild diverticulosis was noted in the sigmoid colon. The colon mucosa was otherwise normal.  Retroflexed views revealed internal hemorrhoids. The time to cecum=4 minutes 49 seconds. Withdrawal time=11 minutes 40 seconds.  The scope was withdrawn and the procedure completed. COMPLICATIONS: There were no  complications.  ENDOSCOPIC IMPRESSION: 1.   Diminutive sessile polyp was found in the ascending colon; polypectomy was performed with a cold snare 2.   Mild diverticulosis was noted in the sigmoid colon 3.   The colon mucosa was otherwise normal  RECOMMENDATIONS: 1. Repeat colonoscopy in 5 years if polyp adenomatous; otherwise 10 years   eSigned:  Eustace Quail, MD 05/07/2013 2:33 PM   cc: Neena Rhymes, MD and The Patient

## 2013-05-07 NOTE — Progress Notes (Signed)
Called to room to assist during endoscopic procedure.  Patient ID and intended procedure confirmed with present staff. Received instructions for my participation in the procedure from the performing physician.  

## 2013-05-07 NOTE — Progress Notes (Signed)
Report to pacu rn, vss, bbs=clear 

## 2013-05-08 ENCOUNTER — Telehealth: Payer: Self-pay | Admitting: *Deleted

## 2013-05-08 NOTE — Telephone Encounter (Signed)
  Follow up Call-  Call back number 05/07/2013  Post procedure Call Back phone  # [phone]  Permission to leave phone message Yes     Patient questions:  Do you have a fever, pain , or abdominal swelling? no Pain Score  0 *  Have you tolerated food without any problems? yes  Have you been able to return to your normal activities? yes  Do you have any questions about your discharge instructions: Diet   no Medications  no Follow up visit  no  Do you have questions or concerns about your Care? no  Actions: * If pain score is 4 or above: No action needed, pain <4.

## 2013-05-14 ENCOUNTER — Encounter: Payer: Self-pay | Admitting: Internal Medicine

## 2013-07-09 ENCOUNTER — Ambulatory Visit (INDEPENDENT_AMBULATORY_CARE_PROVIDER_SITE_OTHER): Payer: BC Managed Care – PPO | Admitting: Internal Medicine

## 2013-07-09 ENCOUNTER — Other Ambulatory Visit (INDEPENDENT_AMBULATORY_CARE_PROVIDER_SITE_OTHER): Payer: BC Managed Care – PPO

## 2013-07-09 ENCOUNTER — Encounter: Payer: Self-pay | Admitting: Internal Medicine

## 2013-07-09 VITALS — BP 122/82 | HR 62 | Temp 97.9°F | Ht 65.0 in | Wt 138.0 lb

## 2013-07-09 DIAGNOSIS — E042 Nontoxic multinodular goiter: Secondary | ICD-10-CM

## 2013-07-09 DIAGNOSIS — D126 Benign neoplasm of colon, unspecified: Secondary | ICD-10-CM

## 2013-07-09 DIAGNOSIS — Z0000 Encounter for general adult medical examination without abnormal findings: Secondary | ICD-10-CM

## 2013-07-09 DIAGNOSIS — Z23 Encounter for immunization: Secondary | ICD-10-CM

## 2013-07-09 LAB — COMPREHENSIVE METABOLIC PANEL
ALK PHOS: 70 U/L (ref 39–117)
ALT: 20 U/L (ref 0–35)
AST: 15 U/L (ref 0–37)
Albumin: 4.7 g/dL (ref 3.5–5.2)
BILIRUBIN TOTAL: 0.6 mg/dL (ref 0.3–1.2)
BUN: 14 mg/dL (ref 6–23)
CO2: 29 meq/L (ref 19–32)
CREATININE: 0.8 mg/dL (ref 0.4–1.2)
Calcium: 9.6 mg/dL (ref 8.4–10.5)
Chloride: 103 mEq/L (ref 96–112)
GFR: 76.93 mL/min (ref >60.00)
Glucose, Bld: 90 mg/dL (ref 70–99)
Potassium: 4.1 mEq/L (ref 3.5–5.1)
Sodium: 139 mEq/L (ref 135–145)
Total Protein: 7.3 g/dL (ref 6.0–8.3)

## 2013-07-09 LAB — LIPID PANEL
CHOLESTEROL: 206 mg/dL — AB (ref 0–200)
HDL: 81 mg/dL (ref >39.00)
LDL Cholesterol: 113 mg/dL — ABNORMAL HIGH (ref 0–99)
Total CHOL/HDL Ratio: 3
Triglycerides: 62 mg/dL (ref 0.0–149.0)
VLDL: 12.4 mg/dL (ref 0.0–40.0)

## 2013-07-09 LAB — TSH: TSH: 3.35 u[IU]/mL (ref 0.35–5.50)

## 2013-07-09 LAB — HEMOGLOBIN AND HEMATOCRIT, BLOOD
HCT: 38.3 % (ref 36.0–46.0)
Hemoglobin: 12.9 g/dL (ref 12.0–15.0)

## 2013-07-09 MED ORDER — FLUTICASONE PROPIONATE 50 MCG/ACT NA SUSP: 1.0000 | Freq: Every day | NASAL | Status: DC | PRN

## 2013-07-09 NOTE — Patient Instructions (Signed)
Thanks for coming to see me.  Your exam is normal. Lab today includes thyroid, cholesterol, blood counts and general chemistries. Results will be posted to [facility]. Thyroid U/S is ordered and results will also be posted to MyChart.  Please schedule with Dr. Carren Rang; ask [facility] to send [place] a copy of mammogram and bone density study when done.  Your are current with colorectal cancer screening. We will give a Tdap today - good for 10 years. Shingles vaccine at 72; pneumonia vaccines at 78.  Please consider Advanced Care Planning - always an important issue. Please review the packet provided today. Two websites for further thought - [email or web address] and [email or web address]   For continuing care we will request Dr. Cathlean Cower - you should hear from the practice about this.
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
## 2013-07-09 NOTE — Progress Notes (Signed)
Pre visit review using our clinic review tool, if applicable. No additional management support is needed unless otherwise documented below in the visit note. 

## 2013-07-10 NOTE — Assessment & Plan Note (Signed)
Interval history is benign. Limited physical exam, sans breast and pelvic-deferred to gyn, normal. Lab results reviewed: in normal range including blood sugar, LDL 113, H/H and normal TSH. She is current with colorectal cancer screening; will be scheduling mammogram, DEXA and gyn exam. Immunization - brought up to date with Tdap.  In summary A nice woman who appears to be medically stable at this time.

## 2013-07-10 NOTE — Assessment & Plan Note (Signed)
Current with colonoscopy - January '15.

## 2013-07-10 NOTE — Assessment & Plan Note (Addendum)
No enlargement of the neck. Exam is normal with an easily palpable thyroid but not particularly large, without discrete nodules or tenderness.  Plan F/u TSH  F/u U/S soft tissue neck.

## 2013-07-11 ENCOUNTER — Telehealth: Payer: Self-pay

## 2013-07-11 NOTE — Telephone Encounter (Signed)
This patient has requested to be transferred to Dr.John as a new patient after Dr.Norins retires.  Dr.John has reached his 50 patient transfer max.  Can this patient be a Dr.John new pt despite this?   Thanks!    

## 2013-07-12 NOTE — Telephone Encounter (Signed)
Ok with me 

## 2013-07-13 ENCOUNTER — Encounter: Payer: Self-pay | Admitting: Internal Medicine

## 2013-07-13 NOTE — Telephone Encounter (Signed)
Pt is aware.  

## 2013-07-16 ENCOUNTER — Ambulatory Visit
Admission: RE | Admit: 2013-07-16 | Discharge: 2013-07-16 | Disposition: A | Discharge status: Home / Self Care | Payer: BC Managed Care – PPO | Source: Ambulatory Visit | Attending: Internal Medicine | Admitting: Internal Medicine

## 2013-07-16 DIAGNOSIS — E042 Nontoxic multinodular goiter: Secondary | ICD-10-CM

## 2013-07-20 ENCOUNTER — Other Ambulatory Visit: Payer: Self-pay | Admitting: Internal Medicine

## 2013-07-20 DIAGNOSIS — E042 Nontoxic multinodular goiter: Secondary | ICD-10-CM

## 2013-08-07 ENCOUNTER — Other Ambulatory Visit (HOSPITAL_COMMUNITY)
Admission: RE | Admit: 2013-08-07 | Discharge: 2013-08-07 | Disposition: A | Discharge status: Home / Self Care | Payer: BC Managed Care – PPO | Source: Ambulatory Visit | Attending: Endocrinology | Admitting: Endocrinology

## 2013-08-07 ENCOUNTER — Ambulatory Visit (INDEPENDENT_AMBULATORY_CARE_PROVIDER_SITE_OTHER): Payer: BC Managed Care – PPO | Admitting: Endocrinology

## 2013-08-07 ENCOUNTER — Encounter: Payer: Self-pay | Admitting: Endocrinology

## 2013-08-07 VITALS — BP 118/60 | HR 75 | Temp 98.2°F | Ht 65.0 in | Wt 138.0 lb

## 2013-08-07 DIAGNOSIS — E042 Nontoxic multinodular goiter: Secondary | ICD-10-CM | POA: Insufficient documentation
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
## 2013-08-09 ENCOUNTER — Telehealth: Payer: Self-pay | Admitting: Internal Medicine

## 2013-08-09 NOTE — Telephone Encounter (Signed)
Patient states that she was in last week for a biopsy and just noticed that the instructions for her has another patients name on this. She is a little concerned and would like a callback.

## 2013-08-09 NOTE — Telephone Encounter (Signed)
Pt's AVS sent and informed that we have results. Just waiting on MD to review.

## 2013-09-15 IMAGING — CT CT ANGIO CHEST
2 of 7 series · 18 of 46 positions shown · IV contrast (APPLIED)
Comparison: None.

CTA CHEST

CLINICAL DATA: THE CHEST PAIN, RADIATING TO BACK.

CT ANGIOGRAPHY CHEST, ABDOMEN AND PELVIS
TECHNIQUE: Multidetector CT imaging through the chest, abdomen and
pelvis was performed using the standard protocol during bolus
administration of intravenous contrast.  Multiplanar reconstructed
images including MIPs were obtained and reviewed to evaluate the
vascular anatomy.
Contrast: 100mL OMNIPAQUE IOHEXOL 350 MG/ML IV SOLN,

[Series 5: dissection 2.0 st · axial · 0.70mm/px · z∈[-564,-16]mm · 15 of 310 slices shown]
[im 18/310  lung]
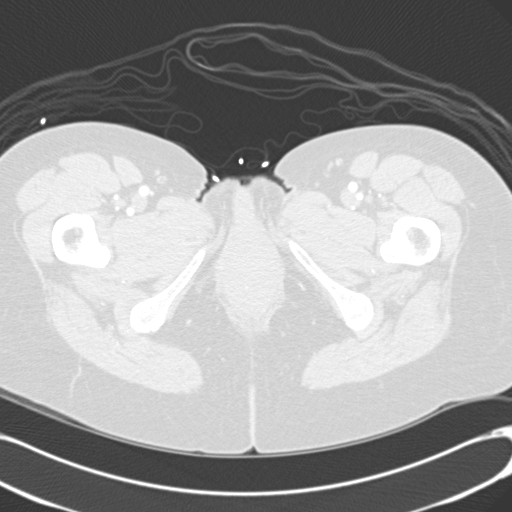
[im 35/310  soft-tissue]
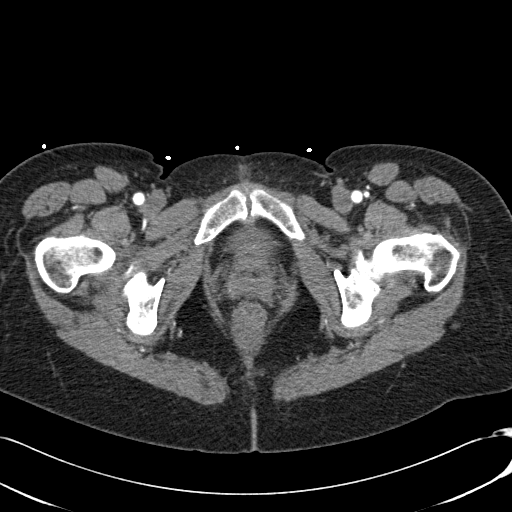
[im 52/310  lung]
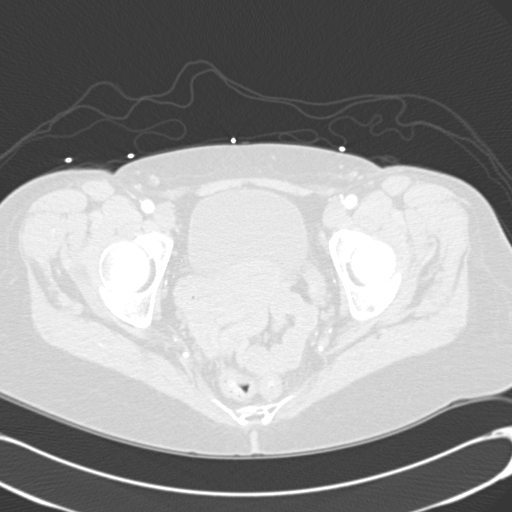
[im 69/310  soft-tissue]
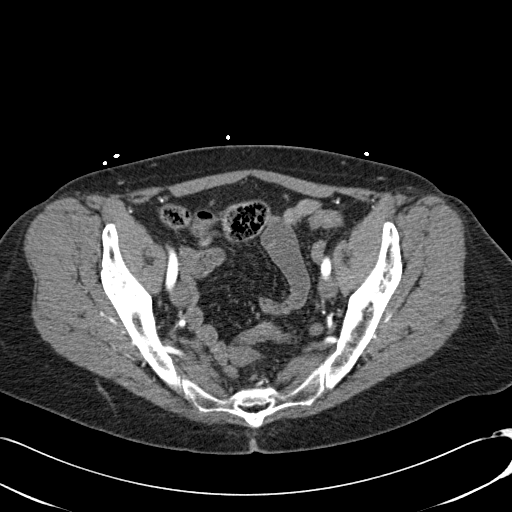
[im 104/310  lung]
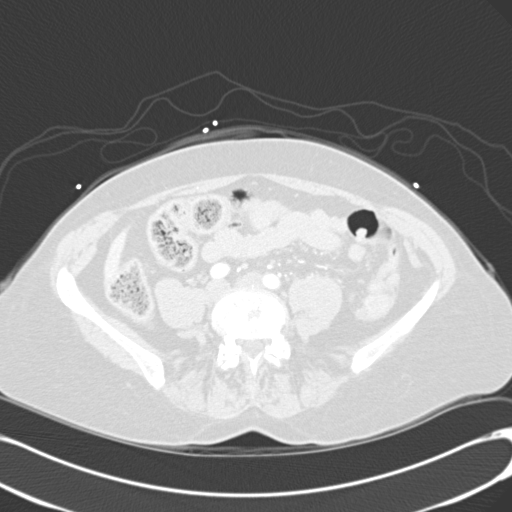
[im 121/310  soft-tissue]
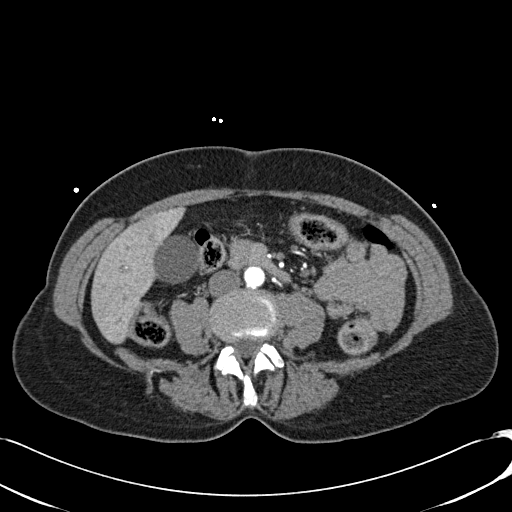
[im 138/310  lung]
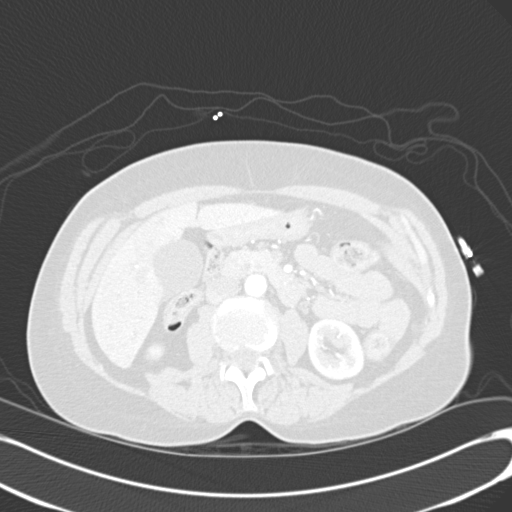
[im 155/310  soft-tissue]
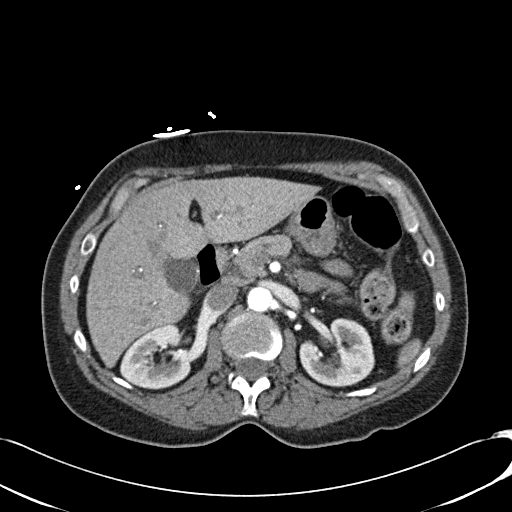
[im 172/310  lung]
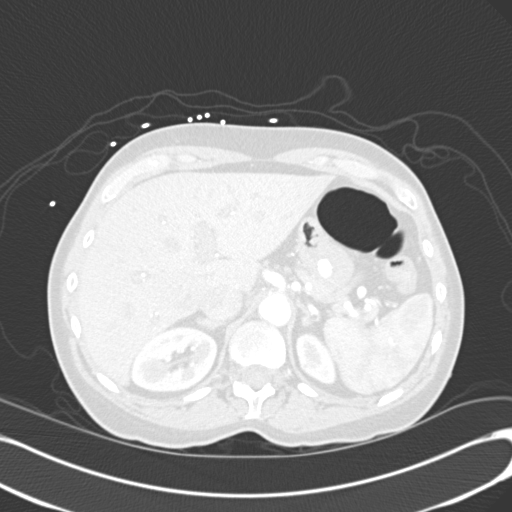
[im 189/310  soft-tissue]
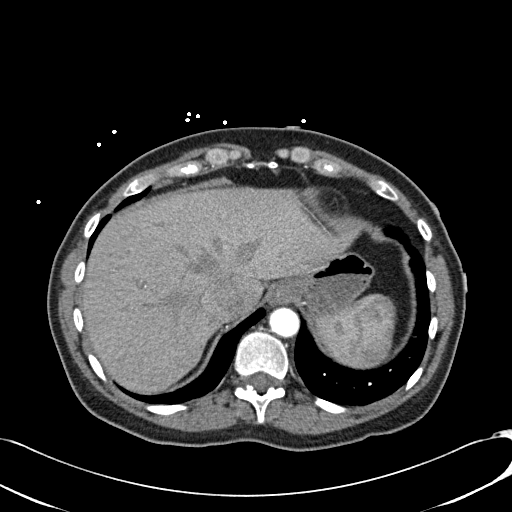
[im 207/310  lung]
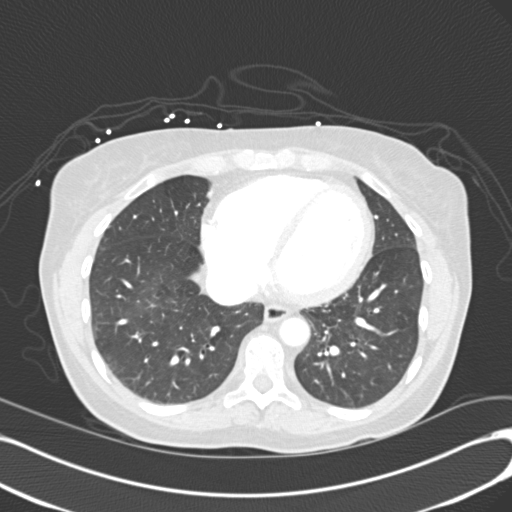
[im 241/310  soft-tissue]
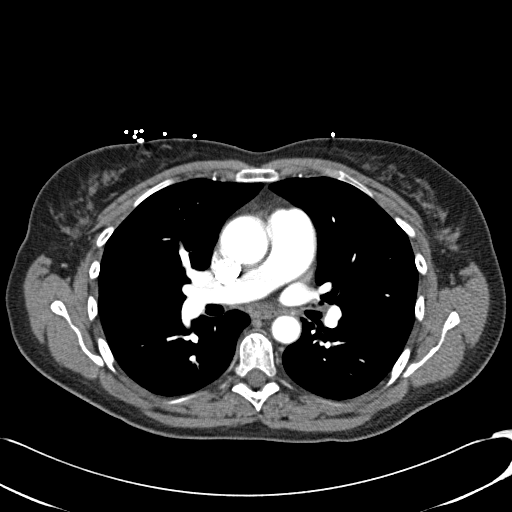
[im 258/310  lung]
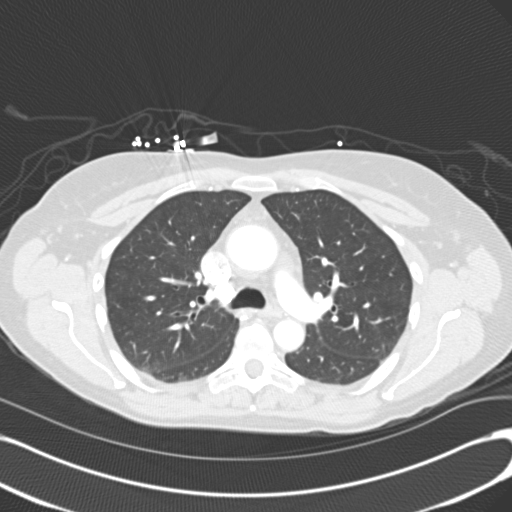
[im 275/310  soft-tissue]
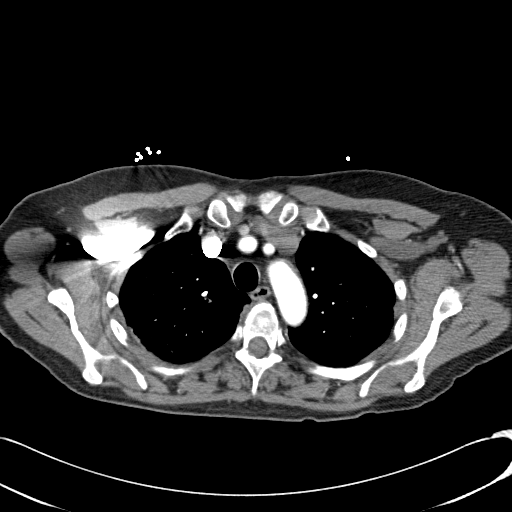
[im 292/310  lung]
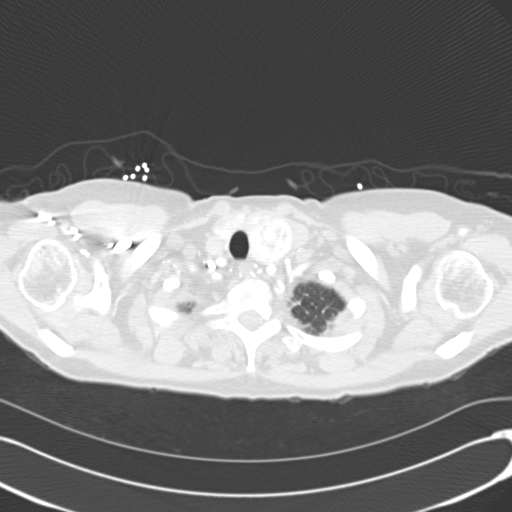

[Series 602: coronal · coronal · 1.21mm/px · 3 of 108 slices shown]
[im 27/108  soft-tissue]
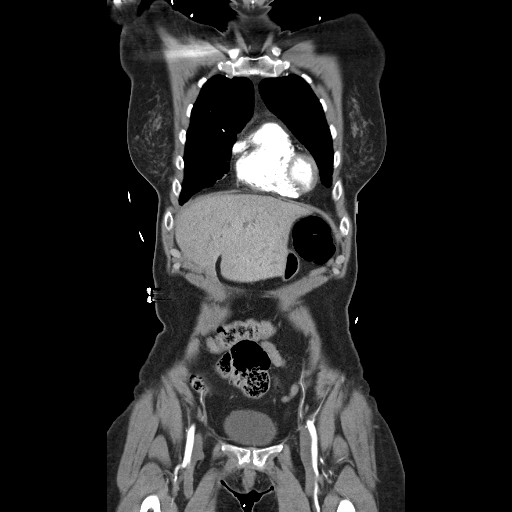
[im 54/108  soft-tissue]
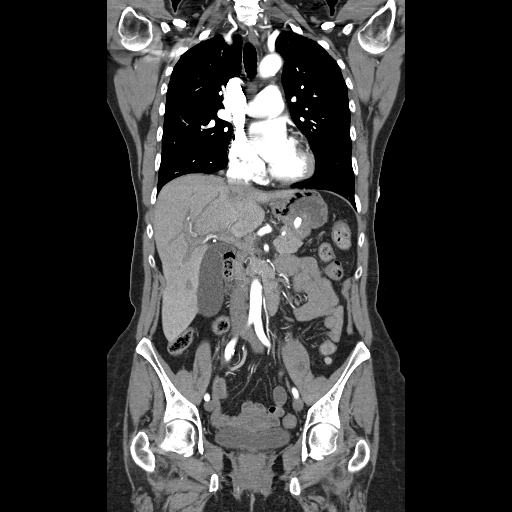
[im 81/108  soft-tissue]
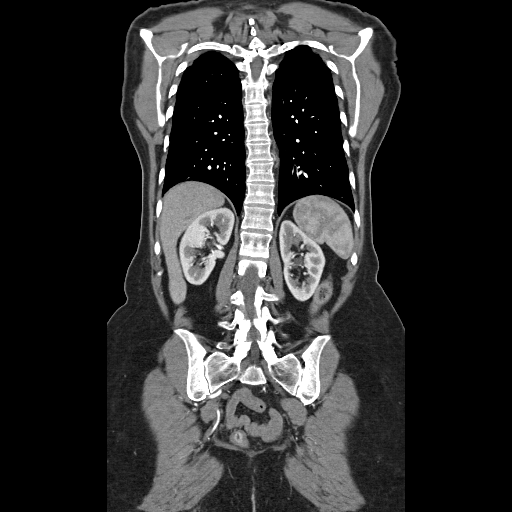

[18 of 46 positions shown; findings below may reference images not displayed]

FINDINGS: Thyroid nodules within the left thyroid lobe and
isthmus.  The largest is in the left thyroid lobe measuring up to
3.2 cm.

Aorta is normal caliber.  No aneurysm or dissection.  Heart is
normal size.

No filling defects in the pulmonary arteries to suggest pulmonary
emboli.  Scarring in the apices.  Ground-glass densities in the
lung bases, likely atelectasis.  Otherwise lungs are clear.  No
effusions. No mediastinal, hilar, or axillary adenopathy.  Chest
wall soft tissues are unremarkable.  No acute bony abnormality.

 Review of the MIP images confirms the above findings.
IMPRESSION: No evidence of aortic aneurysm or dissection.  No evidence of
pulmonary embolus.

No acute findings in the chest.

Thyroid nodules, the largest in the left thyroid lobe measuring up
to 3.2 cm. Findings most likely reflect multinodular goiter.  These
can be further characterized with elective thyroid ultrasound.

CTA ABDOMEN AND PELVIS
FINDINGS: Aorta and iliac vessels are normal caliber.  No
dissection or aneurysm.  Superior mesenteric artery, celiac artery,
inferior mesenteric artery, and single renal arteries bilaterally
are all widely patent.

Liver, gallbladder, spleen, pancreas, adrenals and kidneys are
unremarkable except for a tiny cyst in the upper pole of the left
kidney.  Appendix is visualized and is normal.  A few scattered
descending colonic diverticula.  Large bowel otherwise
unremarkable.  Small bowel is decompressed.  No free fluid, free
air or adenopathy.  Uterus and adnexa grossly unremarkable.

No acute bony abnormality.  Mild rightward scoliosis of the lumbar
spine.

 Review of the MIP images confirms the above findings.
IMPRESSION: No evidence of aortic aneurysm or dissection.

No acute findings in the abdomen or pelvis.

Appendix is normal.

## 2014-01-08 ENCOUNTER — Other Ambulatory Visit: Payer: Self-pay | Admitting: Gynecology

## 2014-01-09 LAB — CYTOLOGY - PAP

## 2014-02-25 ENCOUNTER — Encounter: Payer: Self-pay | Admitting: Endocrinology

## 2014-02-25 ENCOUNTER — Ambulatory Visit (INDEPENDENT_AMBULATORY_CARE_PROVIDER_SITE_OTHER): Payer: BC Managed Care – PPO | Admitting: Endocrinology

## 2014-02-25 VITALS — BP 108/70 | HR 67 | Temp 98.1°F | Ht 65.0 in | Wt 144.0 lb

## 2014-02-25 DIAGNOSIS — R490 Dysphonia: Secondary | ICD-10-CM
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
## 2014-02-25 NOTE — Patient Instructions (Signed)
Please see an ENT specialist.  you will receive a phone call, about a day and time for an appointment. Please return in 1 year.

## 2014-03-01 ENCOUNTER — Telehealth: Payer: Self-pay | Admitting: Endocrinology

## 2014-03-01 NOTE — Telephone Encounter (Signed)
Received 3 pages from [facility], sent to Dr. Loanne Drilling. 03/01/14/ss

## 2014-10-22 ENCOUNTER — Encounter: Payer: Self-pay | Admitting: Internal Medicine

## 2014-12-12 ENCOUNTER — Ambulatory Visit (INDEPENDENT_AMBULATORY_CARE_PROVIDER_SITE_OTHER): Payer: 59 | Admitting: Internal Medicine

## 2014-12-12 ENCOUNTER — Encounter: Payer: Self-pay | Admitting: Internal Medicine

## 2014-12-12 ENCOUNTER — Other Ambulatory Visit (INDEPENDENT_AMBULATORY_CARE_PROVIDER_SITE_OTHER): Payer: 59

## 2014-12-12 VITALS — BP 108/70 | HR 63 | Temp 97.9°F | Ht 65.0 in | Wt 143.0 lb

## 2014-12-12 DIAGNOSIS — Z0000 Encounter for general adult medical examination without abnormal findings: Secondary | ICD-10-CM

## 2014-12-12 DIAGNOSIS — E042 Nontoxic multinodular goiter: Secondary | ICD-10-CM

## 2014-12-12 DIAGNOSIS — Z23 Encounter for immunization: Secondary | ICD-10-CM | POA: Diagnosis not present

## 2014-12-12 LAB — BASIC METABOLIC PANEL WITH GFR
BUN: 17 mg/dL (ref 6–23)
CO2: 28 meq/L (ref 19–32)
Calcium: 9.5 mg/dL (ref 8.4–10.5)
Chloride: 103 meq/L (ref 96–112)
Creatinine, Ser: 0.79 mg/dL (ref 0.40–1.20)
GFR: 78.79 mL/min (ref >60.00)
Glucose, Bld: 91 mg/dL (ref 70–99)
Potassium: 4.3 meq/L (ref 3.5–5.1)
Sodium: 137 meq/L (ref 135–145)

## 2014-12-12 LAB — URINALYSIS, ROUTINE W REFLEX MICROSCOPIC
Bilirubin Urine: NEGATIVE
Hgb urine dipstick: NEGATIVE
Ketones, ur: NEGATIVE
Leukocytes, UA: NEGATIVE
Nitrite: NEGATIVE
RBC / HPF: NONE SEEN (ref 0–?)
Total Protein, Urine: NEGATIVE
URINE GLUCOSE: NEGATIVE
Urobilinogen, UA: 0.2 (ref 0.0–1.0)
pH: 5.5 (ref 5.0–8.0)

## 2014-12-12 LAB — LIPID PANEL
Cholesterol: 188 mg/dL (ref 0–200)
HDL: 65 mg/dL (ref >39.00)
LDL Cholesterol: 108 mg/dL — ABNORMAL HIGH (ref 0–99)
NonHDL: 122.75
Total CHOL/HDL Ratio: 3
Triglycerides: 76 mg/dL (ref 0.0–149.0)
VLDL: 15.2 mg/dL (ref 0.0–40.0)

## 2014-12-12 LAB — CBC WITH DIFFERENTIAL/PLATELET
BASOS PCT: 0.5 % (ref 0.0–3.0)
Basophils Absolute: 0 10*3/uL (ref 0.0–0.1)
EOS ABS: 1.3 10*3/uL — AB (ref 0.0–0.7)
HEMATOCRIT: 37.5 % (ref 36.0–46.0)
HEMOGLOBIN: 12.8 g/dL (ref 12.0–15.0)
Lymphocytes Relative: 26.6 % (ref 12.0–46.0)
Lymphs Abs: 1.5 10*3/uL (ref 0.7–4.0)
MCHC: 34.1 g/dL (ref 30.0–36.0)
MCV: 86.2 fl (ref 78.0–100.0)
Monocytes Absolute: 0.4 10*3/uL (ref 0.1–1.0)
Monocytes Relative: 6.9 % (ref 3.0–12.0)
NEUTROS ABS: 2.4 10*3/uL (ref 1.4–7.7)
Neutrophils Relative %: 42.8 % — ABNORMAL LOW (ref 43.0–77.0)
PLATELETS: 281 10*3/uL (ref 150.0–400.0)
RBC: 4.35 Mil/uL (ref 3.87–5.11)
RDW: 14 % (ref 11.5–15.5)
WBC: 5.6 10*3/uL (ref 4.0–10.5)

## 2014-12-12 LAB — HEPATIC FUNCTION PANEL
ALT: 18 U/L (ref 0–35)
AST: 12 U/L (ref 0–37)
Albumin: 4.4 g/dL (ref 3.5–5.2)
Alkaline Phosphatase: 73 U/L (ref 39–117)
BILIRUBIN DIRECT: 0.1 mg/dL (ref 0.0–0.3)
BILIRUBIN TOTAL: 0.5 mg/dL (ref 0.2–1.2)
Total Protein: 7.3 g/dL (ref 6.0–8.3)

## 2014-12-12 LAB — TSH: TSH: 2.31 u[IU]/mL (ref 0.35–4.50)

## 2014-12-12 NOTE — Patient Instructions (Addendum)
You had the flu shot today  Please continue all other medications as before, and refills have been done if requested.  Please have the pharmacy call with any other refills you may need.  Please continue your efforts at being more active, low cholesterol diet, and weight control.  You are otherwise up to date with prevention measures today.  Please keep your appointments with your specialists as you may have planned  You will be contacted regarding the referral for: GYN (Dr Avanell Shackleton), and Dermatology (Dr Ronnald Ramp), as well as the Thyroid Ultrasound  Please go to the LAB in the Basement (turn left off the elevator) for the tests to be done today  You will be contacted by phone if any changes need to be made immediately.  Otherwise, you will receive a letter about your results with an explanation, but please check with MyChart first.  Please remember to sign up for MyChart if you have not done so, as this will be important to you in the future with finding out test results, communicating by private email, and scheduling acute appointments online when needed.  Please return in 1 year for your yearly visit, or sooner if needed, with Lab testing done 3-5 days before

## 2014-12-12 NOTE — Assessment & Plan Note (Signed)

## 2014-12-12 NOTE — Assessment & Plan Note (Signed)
For f/u US, for f/u endo if changed

## 2014-12-12 NOTE — Progress Notes (Signed)
Pre visit review using our clinic review tool, if applicable. No additional management support is needed unless otherwise documented below in the visit note. 

## 2014-12-12 NOTE — Progress Notes (Signed)
Subjective:    Patient ID: Janice Vincent, female    DOB: 03-30-1956, 59 y.o.   MRN: [id number]  HPI  Here for wellness and f/u;  Overall doing ok;  Pt denies Chest pain, worsening SOB, DOE, wheezing, orthopnea, PND, worsening LE edema, palpitations, dizziness or syncope.  Pt denies neurological change such as new headache, facial or extremity weakness.  Pt denies polydipsia, polyuria, or low sugar symptoms. Pt states overall good compliance with treatment and medications, good tolerability, and has been trying to follow appropriate diet.  Pt denies worsening depressive symptoms, suicidal ideation or panic. No fever, night sweats, wt loss, loss of appetite, or other constitutional symptoms.  Pt states good ability with ADL's, has low fall risk, home safety reviewed and adequate, no other significant changes in hearing or vision, and only occasionally active with exercise.  No complaints. Needs referral for insurance purpose - GYN, and derm  Denies hyper or hypo thyroid symptoms such as voice, skin or hair change. Has known left thyroid nodule/mutlinod goiter, she is not aware of any change, last u/s mar 2015.   Past Medical History  Diagnosis Date  . Elevated TSH   . Postmenopausal   . Right upper quadrant pain     due to IBS  . Childhood asthma   . Osteoporosis   . Anxiety   . DJD (degenerative joint disease)     right thumb  . Seasonal allergies    Past Surgical History  Procedure Laterality Date  . Breast biospy  1981    benign  . Skin lesion excision      multiple moles  . Growth removed from left foot 5th digit  2000    reports that she has never smoked. She has never used smokeless tobacco. She reports that she drinks alcohol. She reports that she does not use illicit drugs. family history is not on file. She was adopted. Allergies  Allergen Reactions  . Sulfonamide Derivatives     unknown   Current Outpatient Prescriptions on File Prior to Visit  Medication Sig Dispense  Refill  . cetirizine (ZYRTEC) 10 MG tablet Take 10 mg by mouth daily as needed. Seasonal allergies    . fluticasone (FLONASE) 50 MCG/ACT nasal spray Place 1 spray into both nostrils daily as needed. For seasonal allergies 16 g 11  . PROAIR HFA 108 (90 BASE) MCG/ACT inhaler     . glucosamine-chondroitin 500-400 MG tablet Take 1 tablet by mouth daily.    . Multiple Vitamin (MULTIVITAMIN) tablet Take 1 tablet by mouth daily.    . Omega-3 Fatty Acids (FISH OIL PO) Take 1 capsule by mouth daily.    . vitamin C (ASCORBIC ACID) 500 MG tablet Take 500 mg by mouth daily.       No current facility-administered medications on file prior to visit.   Review of Systems Constitutional: Negative for increased diaphoresis, other activity, appetite or siginficant weight change other than noted HENT: Negative for worsening hearing loss, ear pain, facial swelling, mouth sores and neck stiffness.   Eyes: Negative for other worsening pain, redness or visual disturbance.  Respiratory: Negative for shortness of breath and wheezing  Cardiovascular: Negative for chest pain and palpitations.  Gastrointestinal: Negative for diarrhea, blood in stool, abdominal distention or other pain Genitourinary: Negative for hematuria, flank pain or change in urine volume.  Musculoskeletal: Negative for myalgias or other joint complaints.  Skin: Negative for color change and wound or drainage.  Neurological: Negative for syncope and  numbness. other than noted Hematological: Negative for adenopathy. or other swelling Psychiatric/Behavioral: Negative for hallucinations, SI, self-injury, decreased concentration or other worsening agitation.      Objective:   Physical Exam BP 108/70 mmHg  Pulse 63  Temp(Src) 97.9 F (36.6 C) (Oral)  Ht 5\' 5"  (1.651 m)  Wt 143 lb (64.864 kg)  BMI 23.80 kg/m2  SpO2 97% VS noted,  Constitutional: Pt is oriented to person, place, and time. Appears well-developed and well-nourished, in no  significant distress Head: Normocephalic and atraumatic.  Right Ear: External ear normal.  Left Ear: External ear normal.  Nose: Nose normal.  Mouth/Throat: Oropharynx is clear and moist.  Eyes: Conjunctivae and EOM are normal. Pupils are equal, round, and reactive to light.  Neck: Normal range of motion. Neck supple. No JVD present. No tracheal deviation present or significant neck LA or mass, has mild enlarged nodular left thyroid Cardiovascular: Normal rate, regular rhythm, normal heart sounds and intact distal pulses.   Pulmonary/Chest: Effort normal and breath sounds without rales or wheezing  Abdominal: Soft. Bowel sounds are normal. NT. No HSM  Musculoskeletal: Normal range of motion. Exhibits no edema.  Lymphadenopathy:  Has no cervical adenopathy.  Neurological: Pt is alert and oriented to person, place, and time. Pt has normal reflexes. No cranial nerve deficit. Motor grossly intact Skin: Skin is warm and dry. No rash noted.  Psychiatric:  Has normal mood and affect. Behavior is normal.      Assessment & Plan:

## 2014-12-13 LAB — HEPATITIS C ANTIBODY: HCV Ab: NEGATIVE

## 2014-12-27 ENCOUNTER — Other Ambulatory Visit: Payer: Self-pay | Admitting: Internal Medicine

## 2014-12-27 ENCOUNTER — Ambulatory Visit
Admission: RE | Admit: 2014-12-27 | Discharge: 2014-12-27 | Disposition: A | Discharge status: Home / Self Care | Payer: 59 | Source: Ambulatory Visit | Attending: Internal Medicine | Admitting: Internal Medicine

## 2014-12-27 DIAGNOSIS — E041 Nontoxic single thyroid nodule: Secondary | ICD-10-CM

## 2014-12-27 DIAGNOSIS — E042 Nontoxic multinodular goiter: Secondary | ICD-10-CM

## 2015-01-13 ENCOUNTER — Other Ambulatory Visit: Payer: Self-pay | Admitting: Gynecology

## 2015-01-15 LAB — CYTOLOGY - PAP

## 2015-01-28 ENCOUNTER — Other Ambulatory Visit (HOSPITAL_COMMUNITY)
Admission: RE | Admit: 2015-01-28 | Discharge: 2015-01-28 | Disposition: A | Discharge status: Home / Self Care | Payer: 59 | Source: Ambulatory Visit | Attending: Diagnostic Radiology | Admitting: Diagnostic Radiology

## 2015-01-28 ENCOUNTER — Ambulatory Visit
Admission: RE | Admit: 2015-01-28 | Discharge: 2015-01-28 | Disposition: A | Discharge status: Home / Self Care | Payer: 59 | Source: Ambulatory Visit | Attending: Internal Medicine | Admitting: Internal Medicine

## 2015-01-28 ENCOUNTER — Other Ambulatory Visit: Payer: Self-pay | Admitting: Internal Medicine

## 2015-01-28 DIAGNOSIS — E041 Nontoxic single thyroid nodule: Secondary | ICD-10-CM | POA: Insufficient documentation

## 2015-02-03 ENCOUNTER — Telehealth: Payer: Self-pay | Admitting: Internal Medicine

## 2015-02-03 NOTE — Telephone Encounter (Signed)
Please call patient regarding results of imaging.

## 2015-02-11 NOTE — Telephone Encounter (Signed)
Pt wanted to know if the results for the thyroid was for both of the biopsies or for just one of the nodule removed.  Pt also wanted to know when she needed to come back.

## 2015-02-12 NOTE — Telephone Encounter (Signed)
Patient notified of these results & verbalized understanding.

## 2015-02-12 NOTE — Telephone Encounter (Signed)
Both nodule biopsies were negative for malignancy

## 2015-02-26 ENCOUNTER — Encounter: Payer: Self-pay | Admitting: Endocrinology

## 2015-02-26 ENCOUNTER — Ambulatory Visit (INDEPENDENT_AMBULATORY_CARE_PROVIDER_SITE_OTHER): Payer: 59 | Admitting: Endocrinology

## 2015-02-26 VITALS — BP 122/72 | HR 65 | Temp 97.9°F | Ht 65.0 in | Wt 143.0 lb

## 2015-02-26 DIAGNOSIS — E042 Nontoxic multinodular goiter: Secondary | ICD-10-CM
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
## 2015-02-26 NOTE — Patient Instructions (Addendum)
No treatment is needed for the thyroid now.   Please return in 2 years.

## 2015-10-20 IMAGING — US US SOFT TISSUE HEAD/NECK
1 series · 13 of 25 positions shown · non-contrast
Comparison: 06/17/2011

CLINICAL DATA: Multinodular goiter.

EXAM:
THYROID ULTRASOUND
TECHNIQUE: Ultrasound examination of the thyroid gland and adjacent soft
tissues was performed.

[Series 1: us soft tissue head/neck · 0.10mm/px · 13 of 75 slices shown]
[im 1/75]
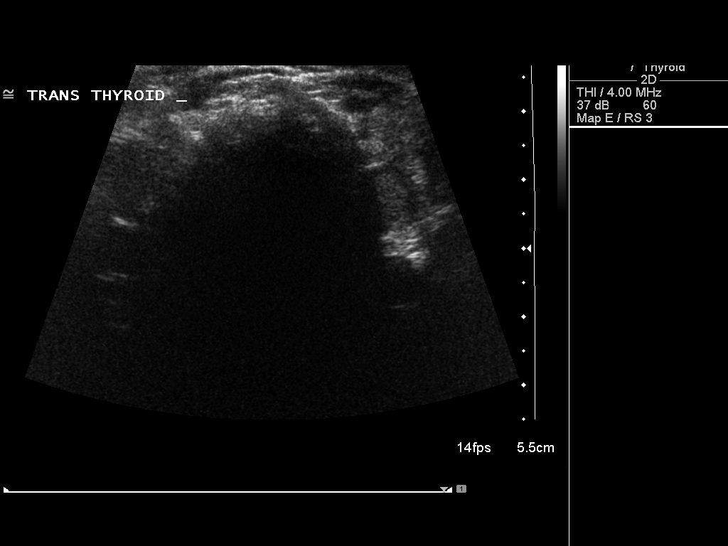
[im 7/75]
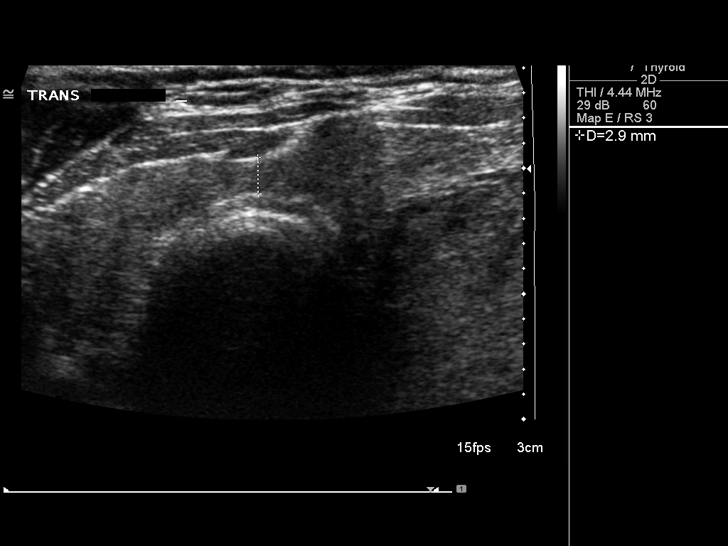
[im 13/75]
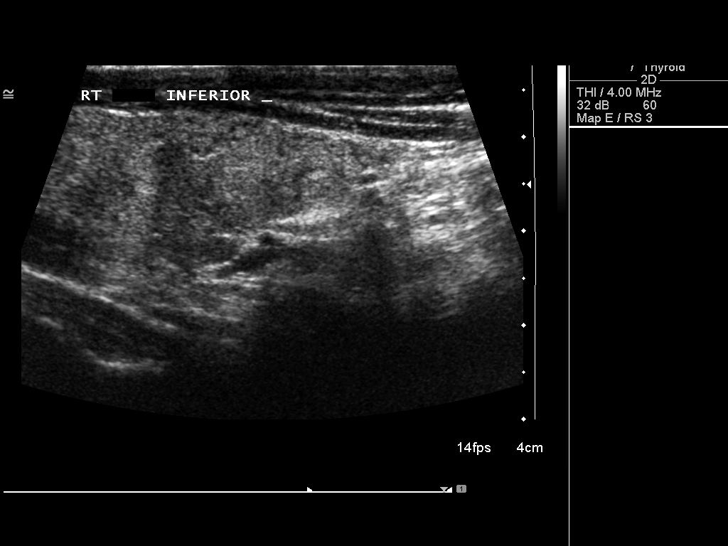
[im 19/75]
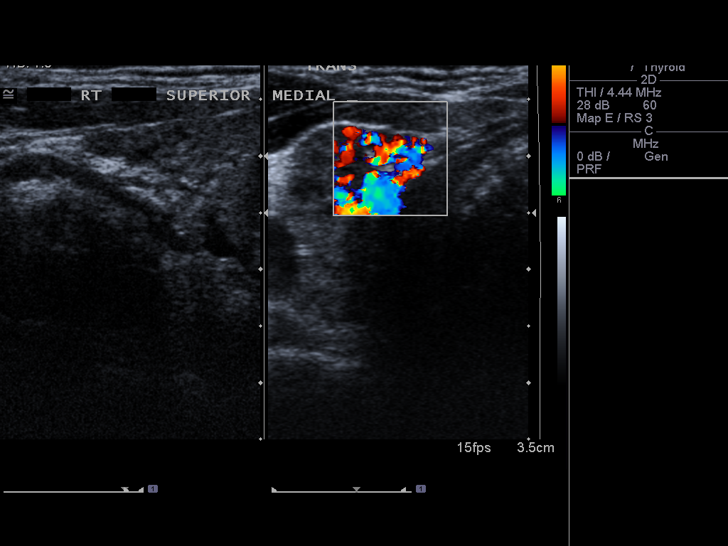
[im 25/75]
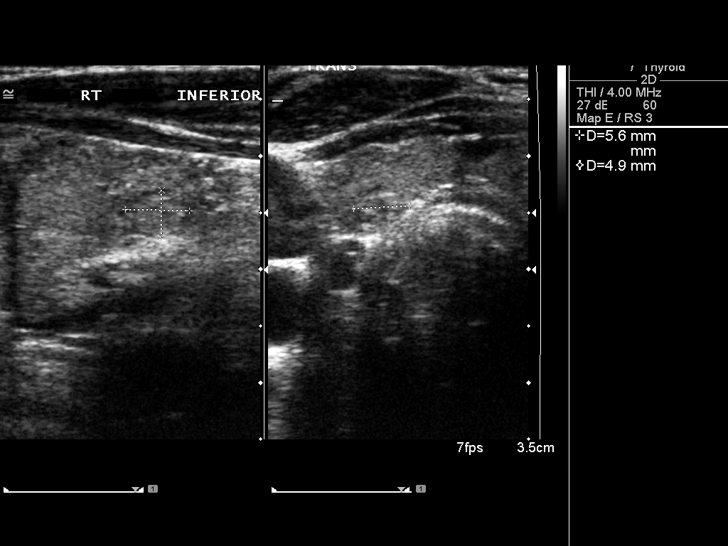
[im 31/75]
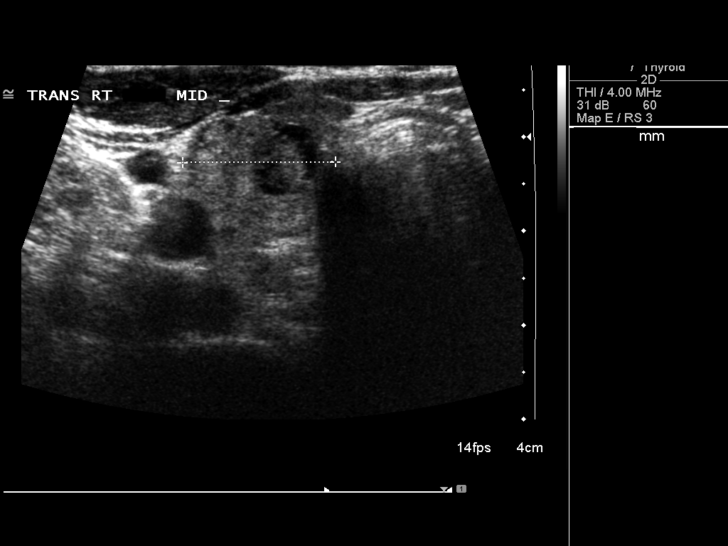
[im 38/75]
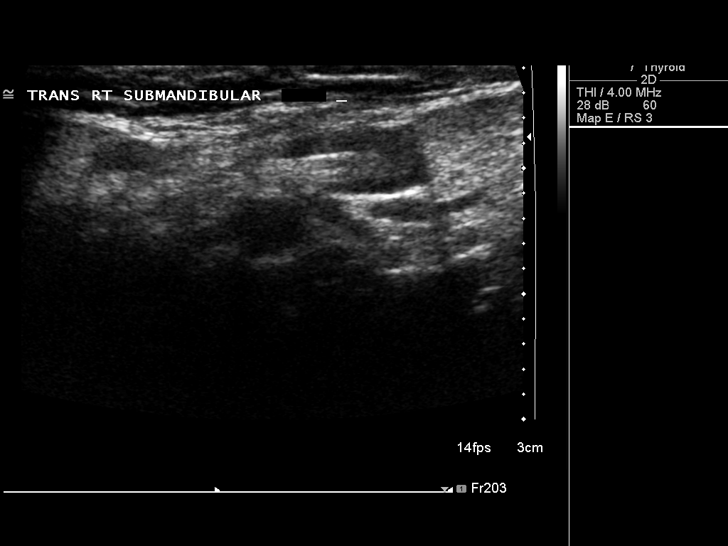
[im 44/75]
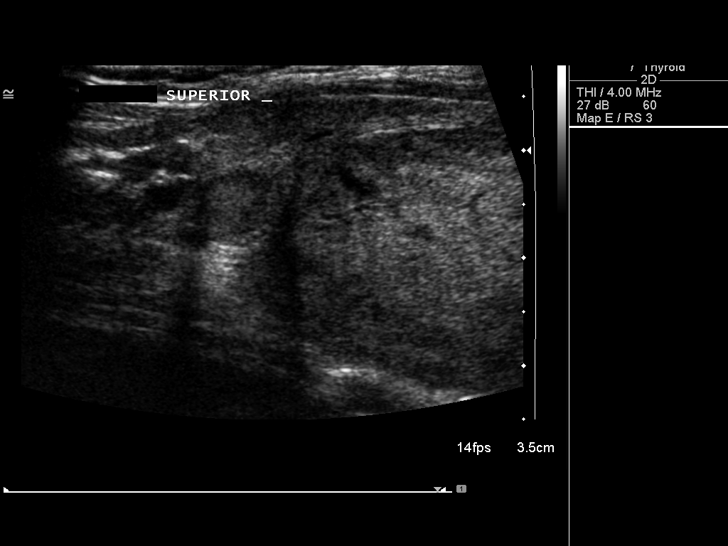
[im 50/75]
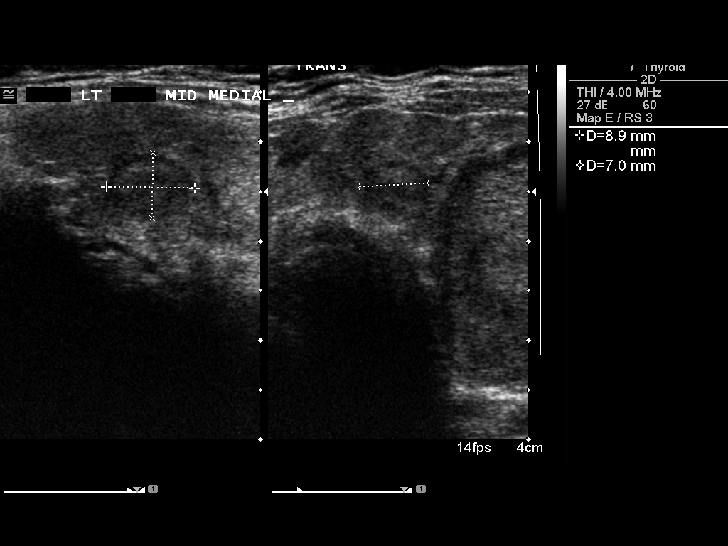
[im 56/75]
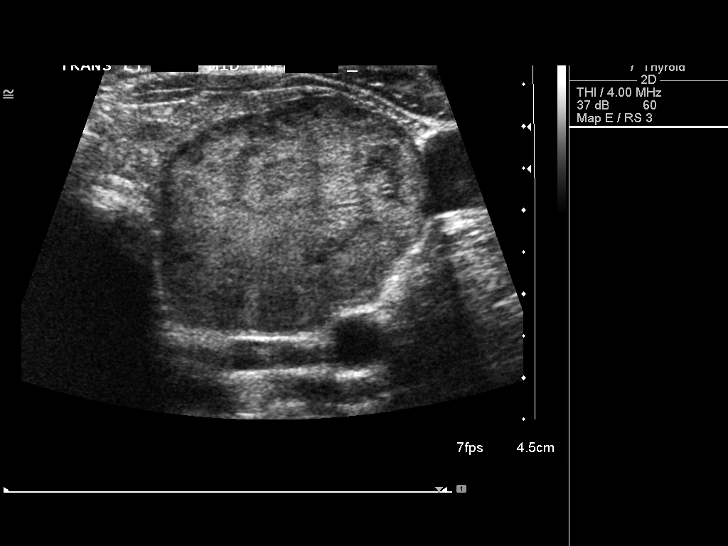
[im 62/75]
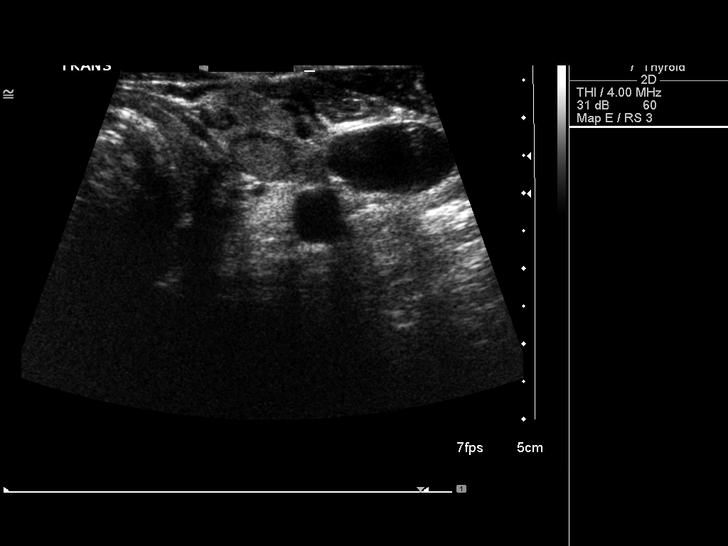
[im 68/75]
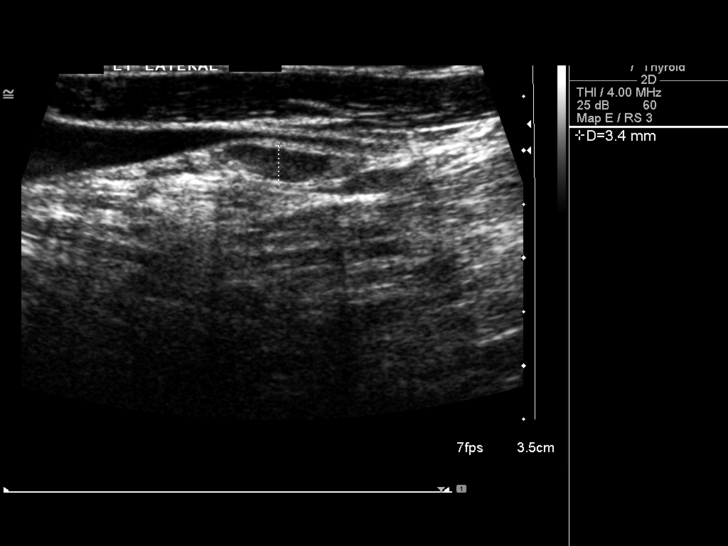
[im 75/75]
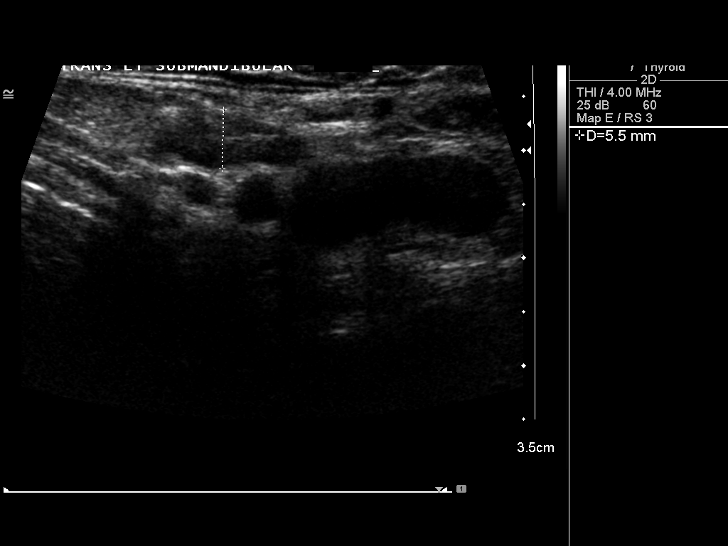

[13 of 25 positions shown; findings below may reference images not displayed]

FINDINGS: Right thyroid lobe

Measurements: 4.9 cm x 2.4 cm x 1.6 cm.

Two superior hypoechoic nodules:  5 mm and 3 mm.

Medial midpole nodule which is hypoechoic near the right isthmus: 6
mm.

Complex solid nodule in the midpole:  10 mm x 8 mm x 8 mm.

Lower pole nodule, isoechoic: 6 mm.

Left thyroid lobe

Measurements: 6.2 cm x 3.0 cm x 3.5 cm.

Dominant, hypervascular, solid and mildly heterogeneous mid to lower
pole nodule: 3.8 cm x 2.7 cm x 3.3 cm.

Adjacent medial lower pole heterogeneous solid hypervascular nodule:
1.8 cm x 1.6 cm x 2.1 cm

Smaller medial midpole mostly isoechoic nodule:  9 mm.

Upper pole, mostly isoechoic, solid nodule:  9 mm.

Isthmus

Thickness: 3 mm.  No nodules visualized.

Lymphadenopathy

None visualized.
IMPRESSION: 1. Mildly enlarged thyroid gland.
2. Multiple nodules.
3. Four nodules on the right were not evident on the prior exam, the
largest being the mid pole, 1 cm complex solid nodule.
4. Dominant nodule arises from the lower pole of the left lobe
measuring 3.8 cm. It is stable as is the adjacent 1.8 cm nodule.

## 2016-02-03 ENCOUNTER — Telehealth: Payer: Self-pay | Admitting: Endocrinology

## 2016-02-03 NOTE — Telephone Encounter (Signed)
I would be happy to check this is the office

## 2016-02-03 NOTE — Telephone Encounter (Signed)
See message and please advise, Thanks!  

## 2016-02-03 NOTE — Telephone Encounter (Signed)
Pt called and said that she wanted to inform Dr. Loanne Drilling that the last couple of days, her thyroid seems to be just a little bit swollen and tender to the touch.  She wanted to know from him what he thought she needed to do. Please advise.

## 2016-02-04 NOTE — Telephone Encounter (Signed)
I contacted the patient and left a voicemail advising of MD's message. Pateint advised to call back and schedule an appointment as needed.

## 2019-09-28 DIAGNOSIS — Z1231 Encounter for screening mammogram for malignant neoplasm of breast: Secondary | ICD-10-CM | POA: Diagnosis not present

## 2019-10-10 DIAGNOSIS — H35371 Puckering of macula, right eye: Secondary | ICD-10-CM | POA: Diagnosis not present

## 2019-10-10 DIAGNOSIS — H18593 Other hereditary corneal dystrophies, bilateral: Secondary | ICD-10-CM | POA: Diagnosis not present

## 2019-10-10 DIAGNOSIS — H524 Presbyopia: Secondary | ICD-10-CM | POA: Diagnosis not present

## 2019-10-10 DIAGNOSIS — H2513 Age-related nuclear cataract, bilateral: Secondary | ICD-10-CM | POA: Diagnosis not present

## 2019-11-23 DIAGNOSIS — H02055 Trichiasis without entropian left lower eyelid: Secondary | ICD-10-CM | POA: Diagnosis not present

## 2019-12-25 ENCOUNTER — Other Ambulatory Visit: Payer: Self-pay

## 2019-12-25 DIAGNOSIS — Z20822 Contact with and (suspected) exposure to covid-19: Secondary | ICD-10-CM

## 2019-12-26 LAB — SARS-COV-2, NAA 2 DAY TAT

## 2019-12-26 LAB — NOVEL CORONAVIRUS, NAA: SARS-CoV-2, NAA: NOT DETECTED

## 2020-01-11 DIAGNOSIS — M8589 Other specified disorders of bone density and structure, multiple sites: Secondary | ICD-10-CM | POA: Diagnosis not present

## 2020-10-01 DIAGNOSIS — Z1231 Encounter for screening mammogram for malignant neoplasm of breast: Secondary | ICD-10-CM | POA: Diagnosis not present

## 2020-10-15 DIAGNOSIS — H5203 Hypermetropia, bilateral: Secondary | ICD-10-CM | POA: Diagnosis not present

## 2020-10-15 DIAGNOSIS — H2513 Age-related nuclear cataract, bilateral: Secondary | ICD-10-CM | POA: Diagnosis not present

## 2020-10-15 DIAGNOSIS — H35371 Puckering of macula, right eye: Secondary | ICD-10-CM | POA: Diagnosis not present

## 2020-11-01 DIAGNOSIS — R002 Palpitations: Secondary | ICD-10-CM | POA: Diagnosis not present

## 2020-11-06 ENCOUNTER — Telehealth: Payer: Self-pay | Admitting: *Deleted

## 2020-11-06 NOTE — Telephone Encounter (Signed)
Referral notes sent from [facility], Phone #:  [phone], Fax #: [phone]   Notes sent to scheduling

## 2020-11-16 DIAGNOSIS — R051 Acute cough: Secondary | ICD-10-CM | POA: Diagnosis not present

## 2020-11-16 DIAGNOSIS — Z20822 Contact with and (suspected) exposure to covid-19: Secondary | ICD-10-CM | POA: Diagnosis not present

## 2020-11-26 ENCOUNTER — Telehealth: Payer: Self-pay

## 2020-11-26 NOTE — Telephone Encounter (Signed)
Spoke to provider, was advised have that she  would have to reschedule  her NP appt.  No VV with new patients. Dm/cma

## 2020-11-26 NOTE — Telephone Encounter (Signed)
Has New Pt appt this Friday, 11/28/20. She test positive for Covid this morning. This is the 2nd time since July 30 she has tested positive. She was prescribed Paxlovid after 1st dx and felt better, tested negative and went back to work. Came home yesterday with a bad headache and tested positive again this morning and has congestion and sinus pressure. Can this be a video visit just for those symptoms? There is a 4pm slot open on Friday if she can be seen in the office or does she need to reschedule and go back to urgent care?  Thanks

## 2020-11-28 ENCOUNTER — Ambulatory Visit: Payer: Self-pay | Admitting: Family Medicine

## 2020-11-30 DIAGNOSIS — J019 Acute sinusitis, unspecified: Secondary | ICD-10-CM | POA: Diagnosis not present

## 2020-12-08 DIAGNOSIS — R002 Palpitations: Secondary | ICD-10-CM | POA: Diagnosis not present

## 2020-12-08 DIAGNOSIS — E041 Nontoxic single thyroid nodule: Secondary | ICD-10-CM | POA: Diagnosis not present

## 2020-12-08 DIAGNOSIS — Z6824 Body mass index (BMI) 24.0-24.9, adult: Secondary | ICD-10-CM | POA: Diagnosis not present

## 2020-12-08 DIAGNOSIS — R7989 Other specified abnormal findings of blood chemistry: Secondary | ICD-10-CM | POA: Diagnosis not present

## 2020-12-08 DIAGNOSIS — Z124 Encounter for screening for malignant neoplasm of cervix: Secondary | ICD-10-CM | POA: Diagnosis not present

## 2020-12-09 ENCOUNTER — Other Ambulatory Visit: Payer: Self-pay | Admitting: Obstetrics and Gynecology

## 2020-12-09 DIAGNOSIS — E041 Nontoxic single thyroid nodule: Secondary | ICD-10-CM

## 2020-12-12 ENCOUNTER — Ambulatory Visit
Admission: RE | Admit: 2020-12-12 | Discharge: 2020-12-12 | Disposition: A | Discharge status: Home / Self Care | Payer: PPO | Source: Ambulatory Visit | Attending: Obstetrics and Gynecology | Admitting: Obstetrics and Gynecology

## 2020-12-12 DIAGNOSIS — E041 Nontoxic single thyroid nodule: Secondary | ICD-10-CM | POA: Diagnosis not present

## 2021-01-21 ENCOUNTER — Ambulatory Visit: Payer: Self-pay | Admitting: Cardiovascular Disease

## 2021-02-05 ENCOUNTER — Ambulatory Visit: Payer: Self-pay | Admitting: Family Medicine

## 2021-02-06 NOTE — Progress Notes (Signed)
Cardiology Office Note   Date:  02/09/2021   ID:  Janice Vincent, DOB 1955/07/05, MRN [id number]  PCP:  Haydee Salter, MD  Cardiologist:   Dillyn Menna Martinique, MD   Chief Complaint  Patient presents with   Palpitations      History of Present Illness: Janice Vincent is a 65 y.o. female who is seen at the request of Dr Owens Shark for evaluation of palpitations. She has generally been in good health. She has noted some palpitations for several years. Since July she has become more concerned about these symptoms. They typically occur at night. May last a couple of hours. Feels her heart pounding fast and "fluttering". On a couple of occasions has noted some chest pressure and occasionally feels she is going to faint. No syncope. Feels symptoms are becoming more frequent.     Past Medical History:  Diagnosis Date   Anxiety    Childhood asthma    DJD (degenerative joint disease)    right thumb   Elevated TSH    Osteoporosis    Postmenopausal    Right upper quadrant pain    due to IBS   Seasonal allergies     Past Surgical History:  Procedure Laterality Date   breast biospy  1981   benign   growth removed from left foot 5th digit  2000   SKIN LESION EXCISION     multiple moles     Current Outpatient Medications  Medication Sig Dispense Refill   cetirizine (ZYRTEC) 10 MG tablet Take 10 mg by mouth daily as needed. Seasonal allergies     fluticasone (FLONASE) 50 MCG/ACT nasal spray Place 1 spray into both nostrils daily as needed. For seasonal allergies 16 g 11   glucosamine-chondroitin 500-400 MG tablet Take 1 tablet by mouth daily.     Multiple Vitamin (MULTIVITAMIN) tablet Take 1 tablet by mouth daily.     Omega-3 Fatty Acids (FISH OIL PO) Take 1 capsule by mouth daily.     PROAIR HFA 108 (90 BASE) MCG/ACT inhaler      vitamin C (ASCORBIC ACID) 500 MG tablet Take 500 mg by mouth daily.       No current facility-administered medications for this visit.    Allergies:    Sulfonamide derivatives    Social History:  The patient  reports that she has never smoked. She has never used smokeless tobacco. She reports current alcohol use of about 5.0 standard drinks per week. She reports that she does not use drugs.   Family History:  she is adopted but found out her biological family history. The patient's family history includes Alcoholism in her mother; Heart failure in her father. She was adopted.    ROS:  Please see the history of present illness.   Otherwise, review of systems are positive for none.   All other systems are reviewed and negative.    PHYSICAL EXAM: VS:  BP 116/62   Pulse 71   Ht 5\' 4"  (1.626 m)   Wt 143 lb 12.8 oz (65.2 kg)   SpO2 97%   BMI 24.68 kg/m  , BMI Body mass index is 24.68 kg/m. GEN: Well nourished, well developed, in no acute distress HEENT: normal Neck: no JVD, carotid bruits, or masses Cardiac: RRR; no murmurs, rubs, or gallops,no edema  Respiratory:  clear to auscultation bilaterally, normal work of breathing GI: soft, nontender, nondistended, + BS MS: no deformity or atrophy Skin: warm and dry, no rash Neuro:  Strength and sensation are intact Psych: euthymic mood, full affect   EKG:  EKG is ordered today. The ekg ordered today demonstrates NSR with sinus arrhythmia. Rate 71. Otherwise normal. I have personally reviewed and interpreted this study.    Recent Labs: No results found for requested labs within last 8760 hours.    Lipid Panel    Component Value Date/Time   CHOL 188 12/12/2014 1600   TRIG 76.0 12/12/2014 1600   HDL 65.00 12/12/2014 1600   CHOLHDL 3 12/12/2014 1600   VLDL 15.2 12/12/2014 1600   LDLCALC 108 (H) 12/12/2014 1600   LDLDIRECT 108.9 09/09/2006 1059      Wt Readings from Last 3 Encounters:  02/09/21 143 lb 12.8 oz (65.2 kg)  02/26/15 143 lb (64.9 kg)  12/12/14 143 lb (64.9 kg)      Other studies Reviewed: Additional studies/ records that were reviewed today include:  none. Review of the above records demonstrates: N/A   ASSESSMENT AND PLAN:  1.  Palpitations - some symptoms of near syncope. No clear triggers. Will request a copy of lab work from Urgent care. Arrange Zio patch monitor and Echo. Follow up after above studies.    Current medicines are reviewed at length with the patient today.  The patient does not have concerns regarding medicines.  The following changes have been made:  no change  Labs/ tests ordered today include:   Orders Placed This Encounter  Procedures   LONG TERM MONITOR (3-14 DAYS)   EKG 12-Lead   ECHOCARDIOGRAM COMPLETE      Disposition:   FU after above.   Signed, Divon Krabill Martinique, MD  02/09/2021 4:22 PM    [facility] [address], [place], [place], [address] Phone [phone], Fax [phone]

## 2021-02-09 ENCOUNTER — Ambulatory Visit (INDEPENDENT_AMBULATORY_CARE_PROVIDER_SITE_OTHER): Payer: PPO | Admitting: Cardiology

## 2021-02-09 ENCOUNTER — Ambulatory Visit (INDEPENDENT_AMBULATORY_CARE_PROVIDER_SITE_OTHER): Payer: PPO

## 2021-02-09 ENCOUNTER — Encounter: Payer: Self-pay | Admitting: Cardiology

## 2021-02-09 ENCOUNTER — Other Ambulatory Visit: Payer: Self-pay

## 2021-02-09 VITALS — BP 116/62 | HR 71 | Ht 64.0 in | Wt 143.8 lb

## 2021-02-09 DIAGNOSIS — R002 Palpitations: Secondary | ICD-10-CM

## 2021-02-09 DIAGNOSIS — R55 Syncope and collapse: Secondary | ICD-10-CM

## 2021-02-09 NOTE — Patient Instructions (Signed)
Medication Instructions:  Continue same medications   Lab Work: None ordered   Testing/Procedures: 14 day Zio Monitor    Will be mailed follow enclosed instructions  Echo    Follow-Up: At [facility], you and your health needs are our priority.  As part of our continuing mission to provide you with exceptional heart care, we have created designated Provider Care Teams.  These Care Teams include your primary Cardiologist (physician) and Advanced Practice Providers (APPs -  Physician Assistants and Nurse Practitioners) who all work together to provide you with the care you need, when you need it.  We recommend signing up for the patient portal called "MyChart".  Sign up information is provided on this After Visit Summary.  MyChart is used to connect with patients for Virtual Visits (Telemedicine).  Patients are able to view lab/test results, encounter notes, upcoming appointments, etc.  Non-urgent messages can be sent to your provider as well.   To learn more about what you can do with MyChart, go to [email or web address].      Your next appointment:  Will be determined after test    The format for your next appointment: Office    Provider:  Dr.Jordan

## 2021-02-09 NOTE — Progress Notes (Unsigned)
Patient enrolled for [employer] to mail a 14 day ZIO XT monitor to her address on file.

## 2021-02-17 ENCOUNTER — Other Ambulatory Visit: Payer: Self-pay

## 2021-02-17 ENCOUNTER — Other Ambulatory Visit (HOSPITAL_BASED_OUTPATIENT_CLINIC_OR_DEPARTMENT_OTHER): Payer: Self-pay | Admitting: Cardiology

## 2021-02-17 ENCOUNTER — Ambulatory Visit (INDEPENDENT_AMBULATORY_CARE_PROVIDER_SITE_OTHER): Payer: PPO

## 2021-02-17 DIAGNOSIS — R002 Palpitations: Secondary | ICD-10-CM

## 2021-02-17 DIAGNOSIS — R55 Syncope and collapse: Secondary | ICD-10-CM

## 2021-02-17 LAB — ECHOCARDIOGRAM COMPLETE
Area-P 1/2: 3.33 cm2
MV M vel: 3.96 m/s
MV Peak grad: 62.7 mmHg
P 1/2 time: 572 msec
S' Lateral: 3.35 cm

## 2021-03-02 ENCOUNTER — Ambulatory Visit: Payer: PPO | Admitting: Cardiology

## 2021-03-09 DIAGNOSIS — R55 Syncope and collapse: Secondary | ICD-10-CM | POA: Diagnosis not present

## 2021-03-09 DIAGNOSIS — R002 Palpitations: Secondary | ICD-10-CM | POA: Diagnosis not present

## 2021-03-11 ENCOUNTER — Other Ambulatory Visit: Payer: Self-pay

## 2021-03-11 DIAGNOSIS — R002 Palpitations: Secondary | ICD-10-CM | POA: Diagnosis not present

## 2021-03-11 DIAGNOSIS — R55 Syncope and collapse: Secondary | ICD-10-CM

## 2021-03-11 LAB — CBC WITH DIFFERENTIAL/PLATELET
Basophils Absolute: 0.1 10*3/uL (ref 0.0–0.2)
Basos: 1 %
EOS (ABSOLUTE): 0.8 10*3/uL — ABNORMAL HIGH (ref 0.0–0.4)
Eos: 17 %
Hematocrit: 36 % (ref 34.0–46.6)
Hemoglobin: 12 g/dL (ref 11.1–15.9)
Immature Grans (Abs): 0 10*3/uL (ref 0.0–0.1)
Immature Granulocytes: 0 %
Lymphocytes Absolute: 0.9 10*3/uL (ref 0.7–3.1)
Lymphs: 20 %
MCH: 29.1 pg (ref 26.6–33.0)
MCHC: 33.3 g/dL (ref 31.5–35.7)
MCV: 87 fL (ref 79–97)
Monocytes Absolute: 0.4 10*3/uL (ref 0.1–0.9)
Monocytes: 9 %
Neutrophils Absolute: 2.5 10*3/uL (ref 1.4–7.0)
Neutrophils: 53 %
Platelets: 307 10*3/uL (ref 150–450)
RBC: 4.12 x10E6/uL (ref 3.77–5.28)
RDW: 12.5 % (ref 11.7–15.4)
WBC: 4.7 10*3/uL (ref 3.4–10.8)

## 2021-03-11 LAB — BASIC METABOLIC PANEL
BUN/Creatinine Ratio: 13 (ref 12–28)
BUN: 13 mg/dL (ref 8–27)
CO2: 23 mmol/L (ref 20–29)
Calcium: 9.1 mg/dL (ref 8.7–10.3)
Chloride: 102 mmol/L (ref 96–106)
Creatinine, Ser: 0.97 mg/dL (ref 0.57–1.00)
Glucose: 89 mg/dL (ref 70–99)
Potassium: 4.6 mmol/L (ref 3.5–5.2)
Sodium: 138 mmol/L (ref 134–144)
eGFR: 64 mL/min/{1.73_m2} (ref >59)

## 2021-03-11 LAB — TSH+FREE T4
Free T4: 0.99 ng/dL (ref 0.82–1.77)
TSH: 4.21 u[IU]/mL (ref 0.450–4.500)

## 2021-03-11 LAB — MAGNESIUM: Magnesium: 2.1 mg/dL (ref 1.6–2.3)

## 2021-03-11 MED ORDER — METOPROLOL SUCCINATE ER 25 MG PO TB24: 25.0000 mg | ORAL_TABLET | Freq: Every day | ORAL | 3 refills | Status: DC

## 2021-03-22 NOTE — Progress Notes (Signed)
Cardiology Office Note   Date:  03/24/2021   ID:  Janice Vincent, DOB Jun 28, 1955, MRN [id number]  PCP:  Haydee Salter, MD  Cardiologist:   Sani Loiseau Martinique, MD   Chief Complaint  Patient presents with   Palpitations       History of Present Illness: Janice Vincent is a 65 y.o. female who is seen for follow up of palpitations. She has generally been in good health. She has noted some palpitations for several years. Since July she has become more concerned about these symptoms. They typically occur at night. May last a couple of hours. Feels her heart pounding fast and "fluttering". On a couple of occasions has noted some chest pressure and occasionally feels she is going to faint. No syncope. Feels symptoms are becoming more frequent.   Evaluation to date includes Echo which was normal. Event monitor showed multiple short runs of PAT which correlated with her symptoms. Labs were normal. She was started on Toprol XL 25 mg daily.  She reports that after starting her medication she noted an improvement in a few days. Pounding sensation has resolved. Still has occasional flutters but they are light. No dizziness. Tolerating medication well. She is concerned about her thyroid but levels were ok. Also concerned about eosinophil count but does have allergies.     Past Medical History:  Diagnosis Date   Anxiety    Childhood asthma    DJD (degenerative joint disease)    right thumb   Elevated TSH    Osteoporosis    Postmenopausal    Right upper quadrant pain    due to IBS   Seasonal allergies     Past Surgical History:  Procedure Laterality Date   breast biospy  1981   benign   growth removed from left foot 5th digit  2000   SKIN LESION EXCISION     multiple moles     Current Outpatient Medications  Medication Sig Dispense Refill   cetirizine (ZYRTEC) 10 MG tablet Take 10 mg by mouth daily as needed. Seasonal allergies     fluticasone (FLONASE) 50 MCG/ACT nasal spray Place  1 spray into both nostrils daily as needed. For seasonal allergies 16 g 11   glucosamine-chondroitin 500-400 MG tablet Take 1 tablet by mouth daily.     metoprolol succinate (TOPROL XL) 25 MG 24 hr tablet Take 1 tablet (25 mg total) by mouth daily. 90 tablet 3   PROAIR HFA 108 (90 BASE) MCG/ACT inhaler      vitamin C (ASCORBIC ACID) 500 MG tablet Take 500 mg by mouth daily.       No current facility-administered medications for this visit.    Allergies:   Sulfonamide derivatives    Social History:  The patient  reports that she has never smoked. She has never used smokeless tobacco. She reports current alcohol use of about 5.0 standard drinks per week. She reports that she does not use drugs.   Family History:  she is adopted but found out her biological family history. The patient's family history includes Alcoholism in her mother; Heart failure in her father. She was adopted.    ROS:  Please see the history of present illness.   Otherwise, review of systems are positive for none.   All other systems are reviewed and negative.    PHYSICAL EXAM: VS:  BP 112/60   Pulse (!) 56   Ht 5\' 4"  (1.626 m)   Wt 145 lb 6.4  oz (66 kg)   SpO2 97%   BMI 24.96 kg/m  , BMI Body mass index is 24.96 kg/m. GEN: Well nourished, well developed, in no acute distress HEENT: normal Neck: no JVD, carotid bruits, or masses Cardiac: RRR; no murmurs, rubs, or gallops,no edema  Respiratory:  clear to auscultation bilaterally, normal work of breathing GI: soft, nontender, nondistended, + BS MS: no deformity or atrophy Skin: warm and dry, no rash Neuro:  Strength and sensation are intact Psych: euthymic mood, full affect   EKG:  EKG is not ordered today.    Recent Labs: 03/11/2021: BUN 13; Creatinine, Ser 0.97; Hemoglobin 12.0; Magnesium 2.1; Platelets 307; Potassium 4.6; Sodium 138; TSH 4.210    Lipid Panel    Component Value Date/Time   CHOL 188 12/12/2014 1600   TRIG 76.0 12/12/2014 1600    HDL 65.00 12/12/2014 1600   CHOLHDL 3 12/12/2014 1600   VLDL 15.2 12/12/2014 1600   LDLCALC 108 (H) 12/12/2014 1600   LDLDIRECT 108.9 09/09/2006 1059      Wt Readings from Last 3 Encounters:  03/24/21 145 lb 6.4 oz (66 kg)  02/09/21 143 lb 12.8 oz (65.2 kg)  02/26/15 143 lb (64.9 kg)      Other studies Reviewed: Additional studies/ records that were reviewed today include:   Echo 02/17/21: IMPRESSIONS     1. Left ventricular ejection fraction, by estimation, is 55 to 60%. Left  ventricular ejection fraction by 3D volume is 57 %. The left ventricle has  normal function. The left ventricle has no regional wall motion  abnormalities. Left ventricular diastolic   parameters were normal. The average left ventricular global longitudinal  strain is -20.0 %. The global longitudinal strain is normal.   2. Right ventricular systolic function is normal. The right ventricular  size is normal. Tricuspid regurgitation signal is inadequate for assessing  PA pressure.   3. The mitral valve is normal in structure. Trivial mitral valve  regurgitation. No evidence of mitral stenosis.   4. The aortic valve is tricuspid. Aortic valve regurgitation is not  visualized.   5. The inferior vena cava is normal in size with greater than 50%  respiratory variability, suggesting right atrial pressure of 3 mmHg.   Comparison(s): No prior Echocardiogram.   Event monitor 03/10/21: Study Highlights    Normal sinus rhythm Rare PACs and PVCs Multiple brief runs of PAT the longest lasting 26 seconds at a rate 135 bpm Symptoms appear to correlate with PAT     Patch Wear Time:  13 days and 0 hours (2022-11-01T20:33:25-0400 to 2022-11-14T20:08:20-0500)   Patient had a min HR of 43 bpm, max HR of 203 bpm, and avg HR of 70 bpm. Predominant underlying rhythm was Sinus Rhythm. 49 Supraventricular Tachycardia runs occurred, the run with the fastest interval lasting 7 beats with a max rate of 203 bpm, the   longest lasting 25.8 secs with an avg rate of 135 bpm. Supraventricular Tachycardia was detected within +/- 45 seconds of symptomatic patient event(s). Isolated SVEs were rare (<1.0%), SVE Couplets were rare (<1.0%), and SVE Triplets were rare (<1.0%).  Isolated VEs were rare (<1.0%), and no VE Couplets or VE Triplets were present.     ASSESSMENT AND PLAN:  1.  Palpitations - event monitor shows multiple runs of PAT longest lasting 26 seconds. Echo and labs are normal. Recommend avoidance of cardiac stimulants like caffeine or decongestants. Continue Toprol at current dose. Will follow up in 6 months.    Current medicines  are reviewed at length with the patient today.  The patient does not have concerns regarding medicines.  The following changes have been made:  no change  Labs/ tests ordered today include:   No orders of the defined types were placed in this encounter.     Disposition:   FU 6 months   Signed, Trevaughn Schear Martinique, MD  03/24/2021 8:22 AM    [facility] [address] [place], [place], [address] Phone [phone], Fax [phone]

## 2021-03-24 ENCOUNTER — Ambulatory Visit: Payer: PPO | Admitting: Cardiology

## 2021-03-24 ENCOUNTER — Other Ambulatory Visit: Payer: Self-pay

## 2021-03-24 ENCOUNTER — Encounter: Payer: Self-pay | Admitting: Cardiology

## 2021-03-24 VITALS — BP 112/60 | HR 56 | Ht 64.0 in | Wt 145.4 lb

## 2021-03-24 DIAGNOSIS — I471 Supraventricular tachycardia: Secondary | ICD-10-CM | POA: Insufficient documentation

## 2021-04-27 ENCOUNTER — Other Ambulatory Visit: Payer: Self-pay

## 2021-04-28 ENCOUNTER — Ambulatory Visit (INDEPENDENT_AMBULATORY_CARE_PROVIDER_SITE_OTHER): Payer: PPO | Admitting: Family Medicine

## 2021-04-28 ENCOUNTER — Encounter: Payer: Self-pay | Admitting: Family Medicine

## 2021-04-28 VITALS — BP 114/68 | HR 60 | Temp 97.1°F | Ht 64.0 in | Wt 146.6 lb

## 2021-04-28 DIAGNOSIS — E042 Nontoxic multinodular goiter: Secondary | ICD-10-CM | POA: Diagnosis not present

## 2021-04-28 DIAGNOSIS — K580 Irritable bowel syndrome with diarrhea: Secondary | ICD-10-CM | POA: Diagnosis not present

## 2021-04-28 DIAGNOSIS — J3089 Other allergic rhinitis: Secondary | ICD-10-CM

## 2021-04-28 DIAGNOSIS — Z13220 Encounter for screening for lipoid disorders: Secondary | ICD-10-CM | POA: Diagnosis not present

## 2021-04-28 NOTE — Progress Notes (Signed)
[facility] [address] [place] [address] Dept: [phone] Dept Fax: [phone]  New Patient Office Visit  Subjective:    Patient ID: Janice Vincent, female    DOB: July 11, 1955, 66 y.o..   MRN: [id number]  Chief Complaint  Patient presents with   Establish Care    NP- establish care.  C/o having nodules on her Thyroid and putting on weight.      History of Present Illness:  Patient is in today to establish care. Ms. Stout was born in [place], [place]. She was given up for adoption soon after birth to a couple living in [place]. Her adoptive parents divorced when she was 50 years old and she and her adoptive mother moved to [place]. She attended [employer], but then married a high school sweetheart. She divorced some years later, after having two children (now 42, 62, and 51). She returned to [employer] and completed a degree in Women's Gender Studies. 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 She has 2 grandchildren. Ms. Dugue denies use of tobacco or drugs. She drinks 4-5 glasses of wine a week. she did finally meet her birth father and learned about her family. Her father had 8 children and is still living at age 60. Her mother died young from alcoholism and had 5 children, many of whom grew up in foster care and some of which died in middle age.  Ms. Vitelli has a history of PAT. She is currently managed on metoprolol and finds this has done well to control her palpitations. She has a past history of a multinodular goiter. This summer she ahd some thyroid testing that showed a mildly elevated TSH, but with normal T4. She has since had follow up testing by her GYN.  Ms. Baria has a history of seasonal allergic rhinitis. She manages this with Flonase and Zyrtec when needed.  Ms. Vandehei has had issues with irritable bowel syndrome for many years. This is primarily associated with diarrhea. She used to  be on long-term medication for this, but not any longer. She has concerns about whether there may be an aspect of a food allergy associated with this and wonders about testing for that.\  Past Medical History: Patient Active Problem List   Diagnosis Date Noted   PAT (paroxysmal atrial tachycardia) ([facility] [place]) 03/24/2021   Hoarseness 02/25/2014   Allergic rhinitis 03/27/2009   History of colon polyps 01/31/2008   Diverticulosis of colon 01/08/2008   Multinodular goiter 12/29/2007   Mild intermittent asthma 02/18/2007   Irritable bowel syndrome with diarrhea 02/18/2007   Past Surgical History:  Procedure Laterality Date   breast biospy  1981   benign   growth removed from left foot 5th digit  2000   SKIN LESION EXCISION     multiple moles   Family History  Adopted: Yes  Problem Relation Age of Onset   Alcoholism Mother    Heart failure Father        now age 46   Macular degeneration Father    Celiac disease Father    Atrial fibrillation Half-Brother    Outpatient Medications Prior to Visit  Medication Sig Dispense Refill   cetirizine (ZYRTEC) 10 MG tablet Take 10 mg by mouth daily as needed. Seasonal allergies     fluticasone (FLONASE) 50 MCG/ACT nasal spray Place 1 spray into both nostrils daily as needed. For seasonal allergies 16 g 11   glucosamine-chondroitin 500-400 MG  tablet Take 1 tablet by mouth daily.     metoprolol succinate (TOPROL XL) 25 MG 24 hr tablet Take 1 tablet (25 mg total) by mouth daily. 90 tablet 3   PROAIR HFA 108 (90 BASE) MCG/ACT inhaler      vitamin C (ASCORBIC ACID) 500 MG tablet Take 500 mg by mouth daily.       No facility-administered medications prior to visit.   Allergies  Allergen Reactions   Sulfonamide Derivatives     unknown   Objective:   Today's Vitals   04/28/21 1401  BP: 114/68  Pulse: 60  Temp: (!) 97.1 F (36.2 C)  TempSrc: Temporal  SpO2: 98%  Weight: 146 lb 9.6 oz (66.5 kg)  Height: 5\' 4"  (1.626 m)   Body mass index is  25.16 kg/m.   General: Well developed, well nourished. No acute distress. Psych: Alert and oriented x3. Normal mood and affect.  Health Maintenance Due  Topic Date Due   Pneumonia Vaccine 29+ Years old (1 - PCV) Never done   MAMMOGRAM  12/01/2016   DEXA SCAN  Never done   COVID-19 Vaccine (5 - Booster for Pfizer series) 10/19/2020   Lab Results Last lipids Lab Results  Component Value Date   CHOL 188 12/12/2014   HDL 65.00 12/12/2014   LDLCALC 108 (H) 12/12/2014   LDLDIRECT 108.9 09/09/2006   TRIG 76.0 12/12/2014   CHOLHDL 3 12/12/2014   Last thyroid functions Lab Results  Component Value Date   TSH 4.210 03/11/2021   Imaging: Thyroid US (12/12/2020) IMPRESSION: 1. Slow interval enlargement of similar appearing thyroid nodules in the left mid and left inferomedial gland which were previously biopsied in 2016. Slow growth of benign nodules is not unexpected. Recommend correlation with prior biopsy results. 2. No new nodules or suspicious features identified.  Assessment & Plan:   1. Seasonal allergic rhinitis due to fungal spores Stable with PRN use of Flonase and Zyrtec.  2. Irritable bowel syndrome with diarrhea I will refer Ms. Boulet to the allergist to evaluate whether food allergies could play a role in her GI symptoms.  - Ambulatory referral to Allergy  3. Multinodular goiter I reviewed both the ultrasound result, the prior biopsy results form 2016, and the current thyroid testing. Evidence supports that she has benign follicular cysts of the thyroid which are not hormonally active. I recommend we consider repeat thyroid testing in a year.  4. Screening for lipid disorders  - Lipid panel  Haydee Salter, MD

## 2021-05-05 ENCOUNTER — Other Ambulatory Visit: Payer: Self-pay

## 2021-05-05 ENCOUNTER — Other Ambulatory Visit (INDEPENDENT_AMBULATORY_CARE_PROVIDER_SITE_OTHER): Payer: PPO

## 2021-05-05 DIAGNOSIS — Z13220 Encounter for screening for lipoid disorders: Secondary | ICD-10-CM | POA: Diagnosis not present

## 2021-05-05 LAB — LIPID PANEL
Cholesterol: 195 mg/dL (ref 0–200)
HDL: 62.5 mg/dL (ref >39.00)
LDL Cholesterol: 118 mg/dL — ABNORMAL HIGH (ref 0–99)
NonHDL: 132.52
Total CHOL/HDL Ratio: 3
Triglycerides: 71 mg/dL (ref 0.0–149.0)
VLDL: 14.2 mg/dL (ref 0.0–40.0)

## 2021-06-19 ENCOUNTER — Ambulatory Visit: Payer: PPO | Admitting: Allergy

## 2021-06-19 ENCOUNTER — Other Ambulatory Visit: Payer: Self-pay

## 2021-06-19 ENCOUNTER — Encounter: Payer: Self-pay | Admitting: Allergy

## 2021-06-19 VITALS — BP 116/68 | HR 67 | Temp 97.8°F | Resp 16 | Ht 64.0 in | Wt 146.4 lb

## 2021-06-19 DIAGNOSIS — R0982 Postnasal drip: Secondary | ICD-10-CM

## 2021-06-19 DIAGNOSIS — H1013 Acute atopic conjunctivitis, bilateral: Secondary | ICD-10-CM | POA: Diagnosis not present

## 2021-06-19 DIAGNOSIS — K580 Irritable bowel syndrome with diarrhea: Secondary | ICD-10-CM

## 2021-06-19 DIAGNOSIS — J3089 Other allergic rhinitis: Secondary | ICD-10-CM

## 2021-06-19 MED ORDER — EPINEPHRINE 0.3 MG/0.3ML IJ SOAJ
0.3000 mg | Freq: Once | INTRAMUSCULAR | 1 refills | Status: AC
Start: 2021-06-19 — End: 2021-06-19

## 2021-06-19 MED ORDER — AZELASTINE HCL 0.1 % NA SOLN: NASAL | 5 refills | Status: DC

## 2021-06-19 NOTE — Progress Notes (Signed)
New Patient Note  RE: Janice Vincent MRN: [id number] DOB: 11/16/1955 Date of Office Visit: 06/19/2021  Primary care provider: Haydee Salter, MD  Chief Complaint: Allergies   History of present illness: Janice Vincent is a 66 y.o. female presenting today for evaluation of allergic rhinitis.   She states she is tired of dealing with her allergies.  She reports constant nasal drainage, throat clearing, nasal congestion, sinus pressure, sneezing, ear fullness. Symptoms are worse during pollen season.  She has had changes to her voice and voice gets raspier and raspier.  Symptoms are year-round but worse seasonally.   She uses flonase 2 sprays daily and does help.  She also will take zyrtec as needed which does help.  Has never been on singulair in the past.  She has performed nasal saline rinses when she feels really "blocked up".   She reports sinus infections with antibiotic courses about 1-2 times a year (typically zpak).  She states she does have benign nodules on thyroid.  She did 2 different courses of allergen immunotherapy once as a child and as a younger adult in her 15s.  She does report immunotherapy was helpful for symptom control back.    She also has IBS with predominately diarrhea.  She has discussed with her PCP in regards to possible food triggers.  Years ago she cut out milk from diet but she still eats cheese.  She is not sure if she has a wheat allergy or not but has limited in diet.  She states fried foods really aggravate her symptoms.  She states as a child she had testing that may have included food allergy.  She has 2 half-brothers with celiac disease.    Review of systems: Review of Systems  Constitutional: Negative.   HENT: Negative.    Eyes: Negative.   Respiratory: Negative.    Cardiovascular: Negative.   Gastrointestinal: Negative.   Musculoskeletal: Negative.   Skin: Negative.   Allergic/Immunologic: Negative.   Neurological: Negative.    All other  systems negative unless noted above in HPI  Past medical history: Past Medical History:  Diagnosis Date   Anxiety    Childhood asthma    DJD (degenerative joint disease)    right thumb   Elevated TSH    Osteoporosis    Postmenopausal    Right upper quadrant pain    due to IBS   Seasonal allergies     Past surgical history: Past Surgical History:  Procedure Laterality Date   breast biospy  1981   benign   growth removed from left foot 5th digit  2000   SKIN LESION EXCISION     multiple moles    Family history:  Family History  Adopted: Yes  Problem Relation Age of Onset   Alcoholism Mother    Heart failure Father        now age 32   Macular degeneration Father    Celiac disease Father    Atrial fibrillation Half-Brother     Social history: Lives in a home with carpeting in the den with oil and heat pump heating and heat pump cooling.  No pets in the home.  There may be mildew in the home.  There is no concern for roaches in the home.  She is an Development worker, international aid.  She denies a smoking history.  Medication List: Current Outpatient Medications  Medication Sig Dispense Refill   azelastine (ASTELIN) 0.1 % nasal spray Use 2 sprays per  nostril 2 times daily for nasal drainage/throat clearing control. 30 mL 5   cetirizine (ZYRTEC) 10 MG tablet Take 10 mg by mouth daily as needed. Seasonal allergies     EPINEPHrine (EPIPEN 2-PAK) 0.3 mg/0.3 mL IJ SOAJ injection Inject 0.3 mg into the muscle once for 1 dose. 1 each 1   fluticasone (FLONASE) 50 MCG/ACT nasal spray Place 1 spray into both nostrils daily as needed. For seasonal allergies 16 g 11   glucosamine-chondroitin 500-400 MG tablet Take 1 tablet by mouth daily.     metoprolol succinate (TOPROL XL) 25 MG 24 hr tablet Take 1 tablet (25 mg total) by mouth daily. 90 tablet 3   PROAIR HFA 108 (90 BASE) MCG/ACT inhaler      vitamin C (ASCORBIC ACID) 500 MG tablet Take 500 mg by mouth daily.       No current  facility-administered medications for this visit.    Known medication allergies: Allergies  Allergen Reactions   Sulfonamide Derivatives     unknown     Physical examination: Blood pressure 116/68, pulse 67, temperature 97.8 F (36.6 C), resp. rate 16, height 5\' 4"  (1.626 m), weight 146 lb 6 oz (66.4 kg), SpO2 97 %.  General: Alert, interactive, in no acute distress. HEENT: PERRLA, TMs pearly gray, turbinates moderately edematous without discharge L>R, post-pharynx non erythematous. Neck: Supple without lymphadenopathy. Lungs: Clear to auscultation without wheezing, rhonchi or rales. {no increased work of breathing. CV: Normal S1, S2 without murmurs. Abdomen: Nondistended, nontender. Skin: Warm and dry, without lesions or rashes. Extremities:  No clubbing, cyanosis or edema. Neuro:   Grossly intact.  Diagnositics/Labs:  Allergy testing:   Airborne Adult Perc - 06/19/21 0900     Time Antigen Placed 3614    Allergen Manufacturer Lavella Hammock    Location Back    Number of Test 59    1. Control-Buffer 50% Glycerol Negative    2. Control-Histamine 1 mg/ml 2+    3. Albumin saline Negative    4. Emmons Negative    5. [place] Negative    6. Johnson 2+    7. Dauphin Blue Negative    8. Meadow Fescue Negative    9. Perennial Rye Negative    10. Sweet Vernal Negative    11. Timothy 2+    12. Cocklebur Negative    13. Burweed Marshelder Negative    14. Ragweed, short Negative    15. Ragweed, Giant Negative    16. Plantain,  English Negative    17. Lamb's Quarters Negative    18. Sheep Sorrell Negative    19. Rough Pigweed 2+    20. Marsh Elder, Rough 2+    21. Mugwort, Common Negative    22. Ash mix 2+    23. Birch mix Negative    24. Beech American 2+    25. Box, Elder 2+    26. Cedar, red Negative    27. Cottonwood, Eastern 3+    28. Elm mix Negative    29. Hickory 3+    30. Maple mix 2+    31. Oak, [place] mix 2+    32. Pecan Pollen 2+    33. Pine mix Negative     34. Sycamore Eastern 2+    35. Monango, Black Pollen 3+    36. Alternaria alternata 3+    37. Cladosporium Herbarum 2+    38. Aspergillus mix 3+    39. Penicillium mix 3+    40. Bipolaris sorokiniana (Helminthosporium) 3+  41. Drechslera spicifera (Curvularia) 2+    42. Mucor plumbeus 2+    43. Fusarium moniliforme 4+    44. Aureobasidium pullulans (pullulara) Negative    45. Rhizopus oryzae 2+    46. Botrytis cinera 4+    47. Epicoccum nigrum Negative    48. Phoma betae Negative    49. Candida Albicans 2+    50. Trichophyton mentagrophytes 3+    51. Mite, D Farinae  5,000 AU/ml 4+    52. Mite, D Pteronyssinus  5,000 AU/ml 2+    53. Cat Hair 10,000 BAU/ml 4+    54.  Dog Epithelia Negative    55. Mixed Feathers Negative    56. Horse Epithelia Negative    57. Cockroach, German Negative    58. Mouse Negative    59. Tobacco Leaf Negative             Food Adult Perc - 06/19/21 0900     Time Antigen Placed 4562    Allergen Manufacturer Lavella Hammock    Location Back    Number of allergen test 18     Control-buffer 50% Glycerol Negative    Control-Histamine 1 mg/ml 2+    1. Peanut Negative    2. Soybean Negative    3. Wheat Negative    5. Milk, cow Negative    30. Barley 2+   3x3   32. Rye  Negative    35. Cottonseed Negative    36. Saccharomyces Cerevisiae  2+   3x3   37. Pork Negative    40. Beef Negative    42. Tomato Negative    50. Cabbage Negative    53. Corn Negative    57. Banana Negative    65. Karaya Gum Negative    66. Acacia (Arabic Gum) Negative             Allergy testing results were read and interpreted by provider, documented by clinical staff.   Assessment and plan: Allergic rhinitis with significant postnasal drainage component  - Testing today showed: grasses, weeds, trees, indoor molds, outdoor molds, dust mites, and cat. - Copy of test results provided.  - Avoidance measures provided. - Continue with: Zyrtec (cetirizine) 10mg  tablet once  daily Flonase (fluticasone) two sprays per nostril daily (AIM FOR EAR ON EACH SIDE) daily for 1-2 weeks at a time before stopping once nasal congestion improves for maximum benefit. - Start taking: Astelin (azelastine) 2 sprays per nostril 2 times daily for nasal drainage/throat clearing control.   - You can use an extra dose of the antihistamine, if needed, for breakthrough symptoms.  - Consider nasal saline rinses 1-2 times daily to remove allergens from the nasal cavities as well as help with mucous clearance (this is especially helpful to do before the nasal sprays are given) - Consider allergy shots as a means of long-term control. - Allergy shots "re-train" and "reset" the immune system to ignore environmental allergens and decrease the resulting immune response to those allergens (sneezing, itchy watery eyes, runny nose, nasal congestion, etc).    - Allergy shots improve symptoms in 80-85% of patients.   - Food allergy testing is positive to barley and bakers yeast.   Thus at this time recommend you monitor for symptoms after eating barely or yeast containing foods.  If you are having symptoms with ingestion of these foods then remove from diet.  If you are not having symptoms after eating these foods then you are sensitized only and can keep in  diet.   - Due to positive food testing recommend you have access to self-injectable Epipen 0.3mg  - Follow emergency action plan in case of allergic reaction - We have discussed the following in regards to foods:   Allergy: food allergy is when you have eaten a food, developed an allergic reaction after eating the food and have IgE to the food (positive food testing either by skin testing or blood testing).  Food allergy could lead to life threatening symptoms  Sensitivity: occurs when you have IgE to a food (positive food testing either by skin testing or blood testing) but is a food you eat without any issues.  This is not an allergy and we recommend  keeping the food in the diet  Intolerance: this is when you have negative testing by either skin testing or blood testing thus not allergic but the food causes symptoms (like belly pain, bloating, diarrhea etc) with ingestion.  These foods should be avoided to prevent symptoms.    Follow-up in 4-6 months or sooner if needed  I appreciate the opportunity to take part in Trinda's care. Please do not hesitate to contact me with questions.  Sincerely,   Prudy Feeler, MD Allergy/Immunology [facility]

## 2021-06-19 NOTE — Patient Instructions (Signed)
-   Testing today showed: grasses, weeds, trees, indoor molds, outdoor molds, dust mites, and cat. ?- Copy of test results provided.  ?- Avoidance measures provided. ?- Continue with: Zyrtec (cetirizine) 10mg  tablet once daily ?Flonase (fluticasone) two sprays per nostril daily (AIM FOR EAR ON EACH SIDE) daily for 1-2 weeks at a time before stopping once nasal congestion improves for maximum benefit. ?- Start taking: Astelin (azelastine) 2 sprays per nostril 2 times daily for nasal drainage/throat clearing control.   ?- You can use an extra dose of the antihistamine, if needed, for breakthrough symptoms.  ?- Consider nasal saline rinses 1-2 times daily to remove allergens from the nasal cavities as well as help with mucous clearance (this is especially helpful to do before the nasal sprays are given) ?- Consider allergy shots as a means of long-term control. ?- Allergy shots "re-train" and "reset" the immune system to ignore environmental allergens and decrease the resulting immune response to those allergens (sneezing, itchy watery eyes, runny nose, nasal congestion, etc).    ?- Allergy shots improve symptoms in 80-85% of patients.  ? ?- Food allergy testing is positive to barley and bakers yeast.   Thus at this time recommend you monitor for symptoms after eating barely or yeast containing foods.  If you are having symptoms with ingestion of these foods then remove from diet.  If you are not having symptoms after eating these foods then you are sensitized only and can keep in diet.   ?- Due to positive food testing recommend you have access to self-injectable Epipen 0.3mg  ?- Follow emergency action plan in case of allergic reaction ?- We have discussed the following in regards to foods: ?  Allergy: food allergy is when you have eaten a food, developed an allergic reaction after eating the food and have IgE to the food (positive food testing either by skin testing or blood testing).  Food allergy could lead to life  threatening symptoms ? Sensitivity: occurs when you have IgE to a food (positive food testing either by skin testing or blood testing) but is a food you eat without any issues.  This is not an allergy and we recommend keeping the food in the diet ? Intolerance: this is when you have negative testing by either skin testing or blood testing thus not allergic but the food causes symptoms (like belly pain, bloating, diarrhea etc) with ingestion.  These foods should be avoided to prevent symptoms.   ? ?Follow-up in 4-6 months or sooner if needed ? ?

## 2021-07-08 ENCOUNTER — Telehealth: Payer: Self-pay | Admitting: Family Medicine

## 2021-07-08 NOTE — Telephone Encounter (Signed)
I attempted to leave message for patient to call back and schedule Medicare Annual Wellness Visit (AWV) in office. Voice mail is full. ? ?If not able to come in office, please offer to do virtually or by telephone.  Left office number and my jabber [phone]. ? ?Due for AWVI ? ?Please schedule at anytime with Nurse Health Advisor. ?  ?

## 2021-08-05 ENCOUNTER — Ambulatory Visit (INDEPENDENT_AMBULATORY_CARE_PROVIDER_SITE_OTHER): Payer: PPO

## 2021-08-05 DIAGNOSIS — Z0000 Encounter for general adult medical examination without abnormal findings: Secondary | ICD-10-CM
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
## 2021-08-05 NOTE — Patient Instructions (Signed)
Janice Vincent , ?Thank you for taking time to come for your Medicare Wellness Visit. I appreciate your ongoing commitment to your health goals. Please review the following plan we discussed and let me know if I can assist you in the future.  ? ?Screening recommendations/referrals: ?Colonoscopy: 05/07/2013 ?Mammogram: 09/17/2020 ?Bone Density: 09/17/2020 ?Recommended yearly ophthalmology/optometry visit for glaucoma screening and checkup ?Recommended yearly dental visit for hygiene and checkup ? ?Vaccinations: ?Influenza vaccine: completed  ?Pneumococcal vaccine: completed  ?Tdap vaccine: 07/09/2013 ?Shingles vaccine: completed    ? ?Advanced directives: yes  ? ?Conditions/risks identified: none  ? ?Next appointment: none  ? ? ?Preventive Care 67 Years and Older, Female ?Preventive care refers to lifestyle choices and visits with your health care provider that can promote health and wellness. ?What does preventive care include? ?A yearly physical exam. This is also called an annual well check. ?Dental exams once or twice a year. ?Routine eye exams. Ask your health care provider how often you should have your eyes checked. ?Personal lifestyle choices, including: ?Daily care of your teeth and gums. ?Regular physical activity. ?Eating a healthy diet. ?Avoiding tobacco and drug use. ?Limiting alcohol use. ?Practicing safe sex. ?Taking low-dose aspirin every day. ?Taking vitamin and mineral supplements as recommended by your health care provider. ?What happens during an annual well check? ?The services and screenings done by your health care provider during your annual well check will depend on your age, overall health, lifestyle risk factors, and family history of disease. ?Counseling  ?Your health care provider may ask you questions about your: ?Alcohol use. ?Tobacco use. ?Drug use. ?Emotional well-being. ?Home and relationship well-being. ?Sexual activity. ?Eating habits. ?History of falls. ?Memory and ability to  understand (cognition). ?Work and work Statistician. ?Reproductive health. ?Screening  ?You may have the following tests or measurements: ?Height, weight, and BMI. ?Blood pressure. ?Lipid and cholesterol levels. These may be checked every 5 years, or more frequently if you are over 67 years old. ?Skin check. ?Lung cancer screening. You may have this screening every year starting at age 67 if you have a 30-pack-year history of smoking and currently smoke or have quit within the past 15 years. ?Fecal occult blood test (FOBT) of the stool. You may have this test every year starting at age 67. ?Flexible sigmoidoscopy or colonoscopy. You may have a sigmoidoscopy every 5 years or a colonoscopy every 10 years starting at age 67. ?Hepatitis C blood test. ?Hepatitis B blood test. ?Sexually transmitted disease (STD) testing. ?Diabetes screening. This is done by checking your blood sugar (glucose) after you have not eaten for a while (fasting). You may have this done every 1-3 years. ?Bone density scan. This is done to screen for osteoporosis. You may have this done starting at age 67. ?Mammogram. This may be done every 1-2 years. Talk to your health care provider about how often you should have regular mammograms. ?Talk with your health care provider about your test results, treatment options, and if necessary, the need for more tests. ?Vaccines  ?Your health care provider may recommend certain vaccines, such as: ?Influenza vaccine. This is recommended every year. ?Tetanus, diphtheria, and acellular pertussis (Tdap, Td) vaccine. You may need a Td booster every 10 years. ?Zoster vaccine. You may need this after age 54. ?Pneumococcal 13-valent conjugate (PCV13) vaccine. One dose is recommended after age 27. ?Pneumococcal polysaccharide (PPSV23) vaccine. One dose is recommended after age 22. ?Talk to your health care provider about which screenings and vaccines you need and how often you  need them. ?This information is not  intended to replace advice given to you by your health care provider. Make sure you discuss any questions you have with your health care provider. ?Document Released: 05/02/2015 Document Revised: 12/24/2015 Document Reviewed: 02/04/2015 ?Elsevier Interactive Patient Education ? 2017 [place]. ? ?Fall Prevention in the Home ?Falls can cause injuries. They can happen to people of all ages. There are many things you can do to make your home safe and to help prevent falls. ?What can I do on the outside of my home? ?Regularly fix the edges of walkways and driveways and fix any cracks. ?Remove anything that might make you trip as you walk through a door, such as a raised step or threshold. ?Trim any bushes or trees on the path to your home. ?Use bright outdoor lighting. ?Clear any walking paths of anything that might make someone trip, such as rocks or tools. ?Regularly check to see if handrails are loose or broken. Make sure that both sides of any steps have handrails. ?Any raised decks and porches should have guardrails on the edges. ?Have any leaves, snow, or ice cleared regularly. ?Use sand or salt on walking paths during winter. ?Clean up any spills in your garage right away. This includes oil or grease spills. ?What can I do in the bathroom? ?Use night lights. ?Install grab bars by the toilet and in the tub and shower. Do not use towel bars as grab bars. ?Use non-skid mats or decals in the tub or shower. ?If you need to sit down in the shower, use a plastic, non-slip stool. ?Keep the floor dry. Clean up any water that spills on the floor as soon as it happens. ?Remove soap buildup in the tub or shower regularly. ?Attach bath mats securely with double-sided non-slip rug tape. ?Do not have throw rugs and other things on the floor that can make you trip. ?What can I do in the bedroom? ?Use night lights. ?Make sure that you have a light by your bed that is easy to reach. ?Do not use any sheets or blankets that are  too big for your bed. They should not hang down onto the floor. ?Have a firm chair that has side arms. You can use this for support while you get dressed. ?Do not have throw rugs and other things on the floor that can make you trip. ?What can I do in the kitchen? ?Clean up any spills right away. ?Avoid walking on wet floors. ?Keep items that you use a lot in easy-to-reach places. ?If you need to reach something above you, use a strong step stool that has a grab bar. ?Keep electrical cords out of the way. ?Do not use floor polish or wax that makes floors slippery. If you must use wax, use non-skid floor wax. ?Do not have throw rugs and other things on the floor that can make you trip. ?What can I do with my stairs? ?Do not leave any items on the stairs. ?Make sure that there are handrails on both sides of the stairs and use them. Fix handrails that are broken or loose. Make sure that handrails are as long as the stairways. ?Check any carpeting to make sure that it is firmly attached to the stairs. Fix any carpet that is loose or worn. ?Avoid having throw rugs at the top or bottom of the stairs. If you do have throw rugs, attach them to the floor with carpet tape. ?Make sure that you have a light switch  at the top of the stairs and the bottom of the stairs. If you do not have them, ask someone to add them for you. ?What else can I do to help prevent falls? ?Wear shoes that: ?Do not have high heels. ?Have rubber bottoms. ?Are comfortable and fit you well. ?Are closed at the toe. Do not wear sandals. ?If you use a stepladder: ?Make sure that it is fully opened. Do not climb a closed stepladder. ?Make sure that both sides of the stepladder are locked into place. ?Ask someone to hold it for you, if possible. ?Clearly mark and make sure that you can see: ?Any grab bars or handrails. ?First and last steps. ?Where the edge of each step is. ?Use tools that help you move around (mobility aids) if they are needed. These  include: ?Canes. ?Walkers. ?Scooters. ?Crutches. ?Turn on the lights when you go into a dark area. Replace any light bulbs as soon as they burn out. ?Set up your furniture so you have a clear path. Avoid moving your furnit

## 2021-09-21 NOTE — Progress Notes (Signed)
Cardiology Office Note   Date:  09/24/2021   ID:  Janice Vincent, DOB 01-14-1956, MRN [id number]  PCP:  Haydee Salter, MD  Cardiologist:   Ercole Georg Martinique, MD   Chief Complaint  Patient presents with   Palpitations       History of Present Illness: Janice Vincent is a 66 y.o. female who is seen for follow up of palpitations. She has generally been in good health. She has noted some palpitations for several years. Since July she has become more concerned about these symptoms. They typically occur at night. May last a couple of hours. Feels her heart pounding fast and "fluttering". On a couple of occasions has noted some chest pressure and occasionally feels she is going to faint. No syncope. Feels symptoms are becoming more frequent.   Evaluation to date includes Echo which was normal. Event monitor showed multiple short runs of PAT which correlated with her symptoms. Labs were normal. She was started on Toprol XL 25 mg daily.  She reports that after starting her medication she noted an improvement in a few days. Pounding sensation has resolved. Still has occasional palpitations but they are light. No dizziness. Doesn't wake her up at night. Can tell a difference if she doesn't take the Toprol. Is seeing an allergist and is considering desensitization therapy.   Past Medical History:  Diagnosis Date   Anxiety    Childhood asthma    DJD (degenerative joint disease)    right thumb   Elevated TSH    Osteoporosis    Postmenopausal    Right upper quadrant pain    due to IBS   Seasonal allergies     Past Surgical History:  Procedure Laterality Date   breast biospy  1981   benign   growth removed from left foot 5th digit  2000   SKIN LESION EXCISION     multiple moles     Current Outpatient Medications  Medication Sig Dispense Refill   azelastine (ASTELIN) 0.1 % nasal spray Use 2 sprays per nostril 2 times daily for nasal drainage/throat clearing control. 30 mL 5    cetirizine (ZYRTEC) 10 MG tablet Take 10 mg by mouth daily as needed. Seasonal allergies     EPINEPHrine 0.3 mg/0.3 mL IJ SOAJ injection SMARTSIG:0.3 Milligram(s) IM Once     fluticasone (FLONASE) 50 MCG/ACT nasal spray Place 1 spray into both nostrils daily as needed. For seasonal allergies 16 g 11   metoprolol succinate (TOPROL XL) 25 MG 24 hr tablet Take 1 tablet (25 mg total) by mouth daily. 90 tablet 3   PROAIR HFA 108 (90 BASE) MCG/ACT inhaler      No current facility-administered medications for this visit.    Allergies:   Sulfonamide derivatives    Social History:  The patient  reports that she has never smoked. She has never used smokeless tobacco. She reports current alcohol use of about 5.0 standard drinks of alcohol per week. She reports that she does not use drugs.   Family History:  she is adopted but found out her biological family history. The patient's family history includes Alcoholism in her mother; Atrial fibrillation in her half-brother; Celiac disease in her father; Heart failure in her father; Macular degeneration in her father. She was adopted.    ROS:  Please see the history of present illness.   Otherwise, review of systems are positive for none.   All other systems are reviewed and negative.  PHYSICAL EXAM: VS:  BP 115/60   Pulse 62   Ht '5\' 4"'$  (1.626 m)   Wt 144 lb (65.3 kg)   SpO2 97%   BMI 24.72 kg/m  , BMI Body mass index is 24.72 kg/m. GEN: Well nourished, well developed, in no acute distress HEENT: normal Neck: no JVD, carotid bruits, or masses Cardiac: RRR; no murmurs, rubs, or gallops,no edema  Respiratory:  clear to auscultation bilaterally, normal work of breathing GI: soft, nontender, nondistended, + BS MS: no deformity or atrophy Skin: warm and dry, no rash Neuro:  Strength and sensation are intact Psych: euthymic mood, full affect   EKG:  EKG is not ordered today.    Recent Labs: 03/11/2021: BUN 13; Creatinine, Ser 0.97;  Hemoglobin 12.0; Magnesium 2.1; Platelets 307; Potassium 4.6; Sodium 138; TSH 4.210    Lipid Panel    Component Value Date/Time   CHOL 195 05/05/2021 0818   TRIG 71.0 05/05/2021 0818   HDL 62.50 05/05/2021 0818   CHOLHDL 3 05/05/2021 0818   VLDL 14.2 05/05/2021 0818   LDLCALC 118 (H) 05/05/2021 0818   LDLDIRECT 108.9 09/09/2006 1059      Wt Readings from Last 3 Encounters:  09/24/21 144 lb (65.3 kg)  06/19/21 146 lb 6 oz (66.4 kg)  04/28/21 146 lb 9.6 oz (66.5 kg)      Other studies Reviewed: Additional studies/ records that were reviewed today include:   Echo 02/17/21: IMPRESSIONS     1. Left ventricular ejection fraction, by estimation, is 55 to 60%. Left  ventricular ejection fraction by 3D volume is 57 %. The left ventricle has  normal function. The left ventricle has no regional wall motion  abnormalities. Left ventricular diastolic   parameters were normal. The average left ventricular global longitudinal  strain is -20.0 %. The global longitudinal strain is normal.   2. Right ventricular systolic function is normal. The right ventricular  size is normal. Tricuspid regurgitation signal is inadequate for assessing  PA pressure.   3. The mitral valve is normal in structure. Trivial mitral valve  regurgitation. No evidence of mitral stenosis.   4. The aortic valve is tricuspid. Aortic valve regurgitation is not  visualized.   5. The inferior vena cava is normal in size with greater than 50%  respiratory variability, suggesting right atrial pressure of 3 mmHg.   Comparison(s): No prior Echocardiogram.   Event monitor 03/10/21: Study Highlights    Normal sinus rhythm Rare PACs and PVCs Multiple brief runs of PAT the longest lasting 26 seconds at a rate 135 bpm Symptoms appear to correlate with PAT     Patch Wear Time:  13 days and 0 hours (2022-11-01T20:33:25-0400 to 2022-11-14T20:08:20-0500)   Patient had a min HR of 43 bpm, max HR of 203 bpm, and avg HR of  70 bpm. Predominant underlying rhythm was Sinus Rhythm. 49 Supraventricular Tachycardia runs occurred, the run with the fastest interval lasting 7 beats with a max rate of 203 bpm, the  longest lasting 25.8 secs with an avg rate of 135 bpm. Supraventricular Tachycardia was detected within +/- 45 seconds of symptomatic patient event(s). Isolated SVEs were rare (<1.0%), SVE Couplets were rare (<1.0%), and SVE Triplets were rare (<1.0%).  Isolated VEs were rare (<1.0%), and no VE Couplets or VE Triplets were present.     ASSESSMENT AND PLAN:  1.  Palpitations - event monitor shows multiple runs of PAT longest lasting 26 seconds. Echo and labs were normal. Recommend avoidance of cardiac  stimulants like caffeine or decongestants. Continue Toprol at current dose. Will follow up in one year   Current medicines are reviewed at length with the patient today.  The patient does not have concerns regarding medicines.  The following changes have been made:  no change  Labs/ tests ordered today include:   No orders of the defined types were placed in this encounter.      Disposition:   FU 1 year   Signed, Willistine Ferrall Martinique, MD  09/24/2021 8:06 AM    [facility] [address] [place], [place], [address] Phone [phone], Fax [phone]

## 2021-09-24 ENCOUNTER — Ambulatory Visit (INDEPENDENT_AMBULATORY_CARE_PROVIDER_SITE_OTHER): Payer: PPO | Admitting: Cardiology

## 2021-09-24 ENCOUNTER — Encounter: Payer: Self-pay | Admitting: Cardiology

## 2021-09-24 VITALS — BP 115/60 | HR 62 | Ht 64.0 in | Wt 144.0 lb

## 2021-09-24 DIAGNOSIS — R002 Palpitations: Secondary | ICD-10-CM

## 2021-09-24 DIAGNOSIS — I471 Supraventricular tachycardia: Secondary | ICD-10-CM

## 2021-09-24 NOTE — Patient Instructions (Signed)
Medication Instructions:  Your physician recommends that you continue on your current medications as directed. Please refer to the Current Medication list given to you today.  *If you need a refill on your cardiac medications before your next appointment, please call your pharmacy*  Follow-Up: At [facility], you and your health needs are our priority.  As part of our continuing mission to provide you with exceptional heart care, we have created designated Provider Care Teams.  These Care Teams include your primary Cardiologist (physician) and Advanced Practice Providers (APPs -  Physician Assistants and Nurse Practitioners) who all work together to provide you with the care you need, when you need it.  We recommend signing up for the patient portal called "MyChart".  Sign up information is provided on this After Visit Summary.  MyChart is used to connect with patients for Virtual Visits (Telemedicine).  Patients are able to view lab/test results, encounter notes, upcoming appointments, etc.  Non-urgent messages can be sent to your provider as well.   To learn more about what you can do with MyChart, go to [email or web address].    Your next appointment:   12 month(s)  The format for your next appointment:   In Person  Provider:   Dr. Martinique   Important Information About Sugar

## 2021-10-06 DIAGNOSIS — M25551 Pain in right hip: Secondary | ICD-10-CM | POA: Diagnosis not present

## 2021-10-07 DIAGNOSIS — M25551 Pain in right hip: Secondary | ICD-10-CM | POA: Diagnosis not present

## 2021-10-23 ENCOUNTER — Ambulatory Visit (INDEPENDENT_AMBULATORY_CARE_PROVIDER_SITE_OTHER): Payer: PPO | Admitting: Allergy

## 2021-10-23 ENCOUNTER — Encounter: Payer: Self-pay | Admitting: Allergy

## 2021-10-23 VITALS — BP 108/68 | HR 60 | Temp 98.7°F | Resp 16 | Ht 64.0 in | Wt 143.2 lb

## 2021-10-23 DIAGNOSIS — R0982 Postnasal drip: Secondary | ICD-10-CM | POA: Diagnosis not present

## 2021-10-23 DIAGNOSIS — J3089 Other allergic rhinitis: Secondary | ICD-10-CM

## 2021-10-23 DIAGNOSIS — Z1231 Encounter for screening mammogram for malignant neoplasm of breast: Secondary | ICD-10-CM | POA: Diagnosis not present

## 2021-10-23 LAB — HM MAMMOGRAPHY

## 2021-10-23 MED ORDER — ALBUTEROL SULFATE HFA 108 (90 BASE) MCG/ACT IN AERS: 1.0000 | INHALATION_SPRAY | RESPIRATORY_TRACT | 1 refills | Status: DC | PRN

## 2021-10-23 MED ORDER — IPRATROPIUM BROMIDE 0.06 % NA SOLN: 2.0000 | Freq: Four times a day (QID) | NASAL | 5 refills | Status: DC

## 2021-10-23 NOTE — Patient Instructions (Signed)
-   Continue avoidance measures for grasses, weeds, trees, indoor molds, outdoor molds, dust mites, and cat. - Continue with: Zyrtec (cetirizine) '10mg'$  tablet once daily as needed Hold Astelin and Flonase for now while trying nasal Atrovent - Start taking: Atrovent 2 sprays per nostril up to 3-4 times a day as needed for nasal drainage/throat clearing control.    Would start with twice a day dosing.   Decrease use if getting too dry - You can use an extra dose of the antihistamine, if needed, for breakthrough symptoms.  - Consider nasal saline rinses 1-2 times daily to remove allergens from the nasal cavities as well as help with mucous clearance (this is especially helpful to do before the nasal sprays are given) - Consider allergy shots as a means of long-term control. - Allergy shots "re-train" and "reset" the immune system to ignore environmental allergens and decrease the resulting immune response to those allergens (sneezing, itchy watery eyes, runny nose, nasal congestion, etc).    - Allergy shots improve symptoms in 80-85% of patients.   - Food allergy testing was positive to barley and bakers yeast.   Continue to monitor for symptoms after eating barely or yeast containing foods.  If you are having symptoms with ingestion of these foods then remove from diet.  If you are not having symptoms after eating these foods then you are sensitized only and can keep in diet.   - Due to positive food testing have access to self-injectable Epipen 0.'3mg'$  - Follow emergency action plan in case of allergic reaction - We have discussed the following in regards to foods:   Allergy: food allergy is when you have eaten a food, developed an allergic reaction after eating the food and have IgE to the food (positive food testing either by skin testing or blood testing).  Food allergy could lead to life threatening symptoms  Sensitivity: occurs when you have IgE to a food (positive food testing either by skin testing  or blood testing) but is a food you eat without any issues.  This is not an allergy and we recommend keeping the food in the diet  Intolerance: this is when you have negative testing by either skin testing or blood testing thus not allergic but the food causes symptoms (like belly pain, bloating, diarrhea etc) with ingestion.  These foods should be avoided to prevent symptoms.    Follow-up in 4-6 months or sooner if needed

## 2021-10-23 NOTE — Progress Notes (Signed)
Follow-up Note  RE: REVIA NGHIEM MRN: [id number] DOB: [birth year] Date of Office Visit: 10/23/2021   History of present illness: NEWELL FRATER is a 66 y.o. female presenting today for follow-up of allergic rhinitis with significant postnasal drainage component.  She was last seen in the office on 06/19/2021 by myself.  She feels like her symptoms have been about the same as her initial visit.  She states she got thru spring without getting sick which is great.   She does feel like astelin has mad the difference in that.  She however does not feel the acid has been a big difference in increasing her nasal drainage however.  She still notes draiange and voice changes.  She was using astelin 2 sprays twice a day as well as flonase until she ran out of the [place].  Since being on Astelin she has not noted any worsening nasal drainage or voice changes.  She will take Zyrtec as needed and states will take a couple times a week may be.  She was requiring more frequently during the spring season.  Review of systems: Review of Systems  Constitutional: Negative.   HENT:         See HPI  Eyes: Negative.   Respiratory: Negative.    Cardiovascular: Negative.   Gastrointestinal: Negative.   Musculoskeletal: Negative.   Skin: Negative.   Allergic/Immunologic: Negative.   Neurological: Negative.      All other systems negative unless noted above in HPI  Past medical/social/surgical/family history have been reviewed and are unchanged unless specifically indicated below.  No changes  Medication List: Current Outpatient Medications  Medication Sig Dispense Refill   azelastine (ASTELIN) 0.1 % nasal spray Use 2 sprays per nostril 2 times daily for nasal drainage/throat clearing control. 30 mL 5   cetirizine (ZYRTEC) 10 MG tablet Take 10 mg by mouth daily as needed. Seasonal allergies     EPINEPHrine 0.3 mg/0.3 mL IJ SOAJ injection SMARTSIG:0.3 Milligram(s) IM Once     fluticasone (FLONASE) 50  MCG/ACT nasal spray Place 1 spray into both nostrils daily as needed. For seasonal allergies 16 g 11   ipratropium (ATROVENT) 0.06 % nasal spray Place 2 sprays into both nostrils 4 (four) times daily. 15 mL 5   metoprolol succinate (TOPROL XL) 25 MG 24 hr tablet Take 1 tablet (25 mg total) by mouth daily. 90 tablet 3   No current facility-administered medications for this visit.     Known medication allergies: Allergies  Allergen Reactions   Sulfonamide Derivatives     unknown     Physical examination: Blood pressure 108/68, pulse 60, temperature 98.7 F (37.1 C), resp. rate 16, height '5\' 4"'$  (1.626 m), weight 143 lb 4 oz (65 kg), SpO2 97 %.  General: Alert, interactive, in no acute distress. HEENT: PERRLA, TMs pearly gray, turbinates non-edematous without discharge, post-pharynx non erythematous. Neck: Supple without lymphadenopathy. Lungs: Clear to auscultation without wheezing, rhonchi or rales. {no increased work of breathing. CV: Normal S1, S2 without murmurs. Abdomen: Nondistended, nontender. Skin: Warm and dry, without lesions or rashes. Extremities:  No clubbing, cyanosis or edema. Neuro:   Grossly intact.  Diagnositics/Labs: None today  Assessment and plan: Allergic rhinitis with significant postnasal drainage component  - Continue avoidance measures for grasses, weeds, trees, indoor molds, outdoor molds, dust mites, and cat. - Continue with: Zyrtec (cetirizine) '10mg'$  tablet once daily as needed Hold Astelin and Flonase for now while trying nasal Atrovent - Start taking: Atrovent 2  sprays per nostril up to 3-4 times a day as needed for nasal drainage/throat clearing control.    Would start with twice a day dosing.   Decrease use if getting too dry - You can use an extra dose of the antihistamine, if needed, for breakthrough symptoms.  - Consider nasal saline rinses 1-2 times daily to remove allergens from the nasal cavities as well as help with mucous clearance (this is  especially helpful to do before the nasal sprays are given) - Consider allergy shots as a means of long-term control. - Allergy shots "re-train" and "reset" the immune system to ignore environmental allergens and decrease the resulting immune response to those allergens (sneezing, itchy watery eyes, runny nose, nasal congestion, etc).    - Allergy shots improve symptoms in 80-85% of patients.   - Food allergy testing was positive to barley and bakers yeast.   Continue to monitor for symptoms after eating barely or yeast containing foods.  If you are having symptoms with ingestion of these foods then remove from diet.  If you are not having symptoms after eating these foods then you are sensitized only and can keep in diet.   - Due to positive food testing have access to self-injectable Epipen 0.'3mg'$  - Follow emergency action plan in case of allergic reaction - We have discussed the following in regards to foods:   Allergy: food allergy is when you have eaten a food, developed an allergic reaction after eating the food and have IgE to the food (positive food testing either by skin testing or blood testing).  Food allergy could lead to life threatening symptoms  Sensitivity: occurs when you have IgE to a food (positive food testing either by skin testing or blood testing) but is a food you eat without any issues.  This is not an allergy and we recommend keeping the food in the diet  Intolerance: this is when you have negative testing by either skin testing or blood testing thus not allergic but the food causes symptoms (like belly pain, bloating, diarrhea etc) with ingestion.  These foods should be avoided to prevent symptoms.    Follow-up in 4-6 months or sooner if needed  I appreciate the opportunity to take part in Samella's care. Please do not hesitate to contact me with questions.  Sincerely,   Prudy Feeler, MD Allergy/Immunology [facility]

## 2021-10-26 ENCOUNTER — Telehealth: Payer: Self-pay

## 2021-10-26 ENCOUNTER — Encounter: Payer: Self-pay | Admitting: Family Medicine

## 2021-10-26 DIAGNOSIS — H18593 Other hereditary corneal dystrophies, bilateral: Secondary | ICD-10-CM | POA: Diagnosis not present

## 2021-10-26 DIAGNOSIS — H5203 Hypermetropia, bilateral: Secondary | ICD-10-CM | POA: Diagnosis not present

## 2021-10-26 DIAGNOSIS — H35371 Puckering of macula, right eye: Secondary | ICD-10-CM | POA: Diagnosis not present

## 2021-10-26 DIAGNOSIS — R928 Other abnormal and inconclusive findings on diagnostic imaging of breast: Secondary | ICD-10-CM

## 2021-10-26 DIAGNOSIS — H2513 Age-related nuclear cataract, bilateral: Secondary | ICD-10-CM | POA: Diagnosis not present

## 2021-10-26 NOTE — Addendum Note (Signed)
Addended by: Haydee Salter on: 10/26/2021 05:14 PM   Modules accepted: Orders

## 2021-10-26 NOTE — Telephone Encounter (Signed)
Received mammogram from [facility], called them and was advised that they do need an order for both a diagnostic mammogram and Ultrasound.   Please and thank you.  Dm/cma

## 2021-10-30 DIAGNOSIS — R922 Inconclusive mammogram: Secondary | ICD-10-CM | POA: Diagnosis not present

## 2021-10-30 DIAGNOSIS — R928 Other abnormal and inconclusive findings on diagnostic imaging of breast: Secondary | ICD-10-CM | POA: Diagnosis not present

## 2021-10-30 LAB — HM MAMMOGRAPHY

## 2021-12-04 ENCOUNTER — Other Ambulatory Visit: Payer: Self-pay | Admitting: Cardiology

## 2021-12-10 DIAGNOSIS — M67432 Ganglion, left wrist: Secondary | ICD-10-CM | POA: Diagnosis not present

## 2021-12-10 DIAGNOSIS — M25839 Other specified joint disorders, unspecified wrist: Secondary | ICD-10-CM | POA: Diagnosis not present

## 2021-12-19 ENCOUNTER — Emergency Department (HOSPITAL_COMMUNITY): Payer: PPO

## 2021-12-19 ENCOUNTER — Emergency Department (HOSPITAL_COMMUNITY)
Admission: EM | Admit: 2021-12-19 | Discharge: 2021-12-19 | Disposition: A | Discharge status: Home / Self Care | Payer: PPO | Attending: Emergency Medicine | Admitting: Emergency Medicine

## 2021-12-19 DIAGNOSIS — S52122A Displaced fracture of head of left radius, initial encounter for closed fracture: Secondary | ICD-10-CM | POA: Diagnosis not present

## 2021-12-19 DIAGNOSIS — Y92410 Unspecified street and highway as the place of occurrence of the external cause: Secondary | ICD-10-CM | POA: Diagnosis not present

## 2021-12-19 DIAGNOSIS — W1830XA Fall on same level, unspecified, initial encounter: Secondary | ICD-10-CM | POA: Diagnosis not present

## 2021-12-19 DIAGNOSIS — Y9301 Activity, walking, marching and hiking: Secondary | ICD-10-CM | POA: Diagnosis not present

## 2021-12-19 DIAGNOSIS — R42 Dizziness and giddiness: Secondary | ICD-10-CM | POA: Diagnosis not present

## 2021-12-19 DIAGNOSIS — M25571 Pain in right ankle and joints of right foot: Secondary | ICD-10-CM | POA: Insufficient documentation

## 2021-12-19 DIAGNOSIS — T1490XA Injury, unspecified, initial encounter: Secondary | ICD-10-CM | POA: Diagnosis not present

## 2021-12-19 DIAGNOSIS — R001 Bradycardia, unspecified: Secondary | ICD-10-CM | POA: Diagnosis not present

## 2021-12-19 DIAGNOSIS — Z23 Encounter for immunization: Secondary | ICD-10-CM | POA: Insufficient documentation

## 2021-12-19 DIAGNOSIS — M25561 Pain in right knee: Secondary | ICD-10-CM | POA: Insufficient documentation

## 2021-12-19 DIAGNOSIS — R231 Pallor: Secondary | ICD-10-CM | POA: Diagnosis not present

## 2021-12-19 DIAGNOSIS — W19XXXA Unspecified fall, initial encounter: Secondary | ICD-10-CM | POA: Diagnosis not present

## 2021-12-19 DIAGNOSIS — M25512 Pain in left shoulder: Secondary | ICD-10-CM | POA: Diagnosis not present

## 2021-12-19 DIAGNOSIS — I959 Hypotension, unspecified: Secondary | ICD-10-CM | POA: Diagnosis not present

## 2021-12-19 DIAGNOSIS — S6992XA Unspecified injury of left wrist, hand and finger(s), initial encounter: Secondary | ICD-10-CM | POA: Diagnosis present

## 2021-12-19 LAB — CBC
HCT: 36.1 % (ref 36.0–46.0)
Hemoglobin: 11.9 g/dL — ABNORMAL LOW (ref 12.0–15.0)
MCH: 29.5 pg (ref 26.0–34.0)
MCHC: 33 g/dL (ref 30.0–36.0)
MCV: 89.6 fL (ref 80.0–100.0)
Platelets: 269 10*3/uL (ref 150–400)
RBC: 4.03 MIL/uL (ref 3.87–5.11)
RDW: 12.8 % (ref 11.5–15.5)
WBC: 10.3 10*3/uL (ref 4.0–10.5)
nRBC: 0 % (ref 0.0–0.2)

## 2021-12-19 LAB — URINALYSIS, ROUTINE W REFLEX MICROSCOPIC
Bacteria, UA: NONE SEEN
Bilirubin Urine: NEGATIVE
Glucose, UA: NEGATIVE mg/dL
Hgb urine dipstick: NEGATIVE
Ketones, ur: 20 mg/dL — AB
Nitrite: NEGATIVE
Protein, ur: NEGATIVE mg/dL
Specific Gravity, Urine: 1.01 (ref 1.005–1.030)
pH: 5 (ref 5.0–8.0)

## 2021-12-19 LAB — COMPREHENSIVE METABOLIC PANEL
ALT: 17 U/L (ref 0–44)
AST: 15 U/L (ref 15–41)
Albumin: 3.8 g/dL (ref 3.5–5.0)
Alkaline Phosphatase: 58 U/L (ref 38–126)
Anion gap: 10 (ref 5–15)
BUN: 11 mg/dL (ref 8–23)
CO2: 20 mmol/L — ABNORMAL LOW (ref 22–32)
Calcium: 8.5 mg/dL — ABNORMAL LOW (ref 8.9–10.3)
Chloride: 107 mmol/L (ref 98–111)
Creatinine, Ser: 1.03 mg/dL — ABNORMAL HIGH (ref 0.44–1.00)
GFR, Estimated: 60 mL/min — ABNORMAL LOW (ref >60)
Glucose, Bld: 94 mg/dL (ref 70–99)
Potassium: 3.7 mmol/L (ref 3.5–5.1)
Sodium: 137 mmol/L (ref 135–145)
Total Bilirubin: 0.7 mg/dL (ref 0.3–1.2)
Total Protein: 6.1 g/dL — ABNORMAL LOW (ref 6.5–8.1)

## 2021-12-19 MED ORDER — OXYCODONE-ACETAMINOPHEN 5-325 MG PO TABS: 1.0000 | ORAL_TABLET | Freq: Four times a day (QID) | ORAL | 0 refills | Status: AC | PRN

## 2021-12-19 MED ORDER — LACTATED RINGERS IV BOLUS
1000.0000 mL | Freq: Once | INTRAVENOUS | Status: AC
  Administered 2021-12-19: 1000 mL via INTRAVENOUS

## 2021-12-19 MED ORDER — ACETAMINOPHEN 325 MG PO TABS
650.0000 mg | ORAL_TABLET | Freq: Once | ORAL | Status: AC
  Administered 2021-12-19: 650 mg via ORAL
  Filled 2021-12-19: qty 2

## 2021-12-19 MED ORDER — OXYCODONE-ACETAMINOPHEN 5-325 MG PO TABS
1.0000 | ORAL_TABLET | Freq: Once | ORAL | Status: AC
  Administered 2021-12-19: 1 via ORAL
  Filled 2021-12-19: qty 1

## 2021-12-19 MED ORDER — TETANUS-DIPHTH-ACELL PERTUSSIS 5-2.5-18.5 LF-MCG/0.5 IM SUSY
0.5000 mL | PREFILLED_SYRINGE | Freq: Once | INTRAMUSCULAR | Status: AC
Start: 2021-12-19 — End: 2021-12-19
  Administered 2021-12-19: 0.5 mL via INTRAMUSCULAR
  Filled 2021-12-19: qty 0.5

## 2021-12-19 NOTE — Progress Notes (Signed)
Orthopedic Tech Progress Note Patient Details:  Janice Vincent 1955/06/25 [id number]  Well-padded posterior long arm splint and sling applied to LUE. Pt had no complaints of any areas of tightness or discomfort from the splint. Motor function and sensation of digits remained intact. Pt does note some mild tingling of her L middle and ring fingers that has been present since time of injury.  Ortho Devices Type of Ortho Device: Post (long arm) splint, Sling immobilizer Ortho Device/Splint Location: LUE Ortho Device/Splint Interventions: Ordered, Application, Adjustment   Post Interventions Patient Tolerated: Well Instructions Provided: Care of device, Adjustment of device  Jasmine Mcbeth Jeri Modena 12/19/2021, 5:29 PM

## 2021-12-19 NOTE — Discharge Instructions (Signed)
Contact the number below for orthopedic surgery follow-up.  You can follow-up with your own orthopedist as well.  You have a radial head fracture and should get seen by the end of the week.  Treat pain with ibuprofen and Tylenol.  A prescription for narcotic pain medication was sent to your pharmacy.  Take this only as needed.

## 2021-12-19 NOTE — ED Triage Notes (Signed)
Patient had a mechanical fall while walking in the road. The grade of the road was uneven which prompted patient to fall. However, patient felt dizzy afterwards and w/ EMS evaluation they discovered the patients BP to be 70/40 initially. After 500 mls of NS there was minor improvement to BP 100/50.  No blood thinners, denies hitting her head

## 2021-12-19 NOTE — ED Notes (Signed)
DC papers reviewed. No questions or concerns. No signs of distress. Pt assisted to wheelchair and out to lobby. Appropriate measures for safety taken. 

## 2021-12-19 NOTE — ED Provider Notes (Signed)
[place] EMERGENCY DEPARTMENT Provider Note   CSN: [id number] Arrival date & time: 12/19/21  1413     History  No chief complaint on file.   Janice Vincent is a 66 y.o. female.  HPI Patient presents for a fall.  Medical history includes osteoporosis, anxiety.  She describes the fall as mechanical in nature.  At the time, she was walking in her neighborhood.  She took a misstep on the edge of the road where it slips down into the gutter.  This caused her to roll her right ankle.  She fell to the ground onto her outstretched left arm.  She has since had pain in right ankle, right knee, left shoulder, and left elbow.  Pain is most pronounced in the posterior aspect of her left elbow.  She has been having paresthesias in the distribution of her ulnar nerve.  EMS was called to the scene.  When they arrived, patient was pale and found to be hypotensive.  She takes a small, 12.5 mg dose of metoprolol daily.  This is for history of palpitations.  She did take her dose this morning.  She does not take any other antihypertensive medications.  EMS provided 500 cc of IV fluid prior to arrival.  Patient did have improvement in her blood pressures.  Patient states that she was in her normal state of health prior to the fall, which occurred 1 hour prior to arrival.    Home Medications Prior to Admission medications   Medication Sig Start Date End Date Taking? Authorizing Provider  oxyCODONE-acetaminophen (PERCOCET/ROXICET) 5-325 MG tablet Take 1 tablet by mouth every 6 (six) hours as needed for up to 5 days for severe pain. 12/19/21 12/24/21 Yes Godfrey Pick, MD  azelastine (ASTELIN) 0.1 % nasal spray Use 2 sprays per nostril 2 times daily for nasal drainage/throat clearing control. 06/19/21   Kennith Gain, MD  cetirizine (ZYRTEC) 10 MG tablet Take 10 mg by mouth daily as needed. Seasonal allergies    [provider]  EPINEPHrine 0.3 mg/0.3 mL IJ SOAJ injection  SMARTSIG:0.3 Milligram(s) IM Once 06/19/21   [provider]  fluticasone (FLONASE) 50 MCG/ACT nasal spray Place 1 spray into both nostrils daily as needed. For seasonal allergies 07/09/13   Norins, Heinz Knuckles, MD  ipratropium (ATROVENT) 0.06 % nasal spray Place 2 sprays into both nostrils 4 (four) times daily. 10/23/21   Kennith Gain, MD  metoprolol succinate (TOPROL-XL) 25 MG 24 hr tablet TAKE 1 TABLET(25 MG) BY MOUTH DAILY 12/04/21   Martinique, Peter M, MD      Allergies    Sulfonamide derivatives    Review of Systems   Review of Systems  Musculoskeletal:  Positive for arthralgias.  Neurological:  Positive for dizziness.    Physical Exam Updated Vital Signs BP (!) 109/51   Pulse (!) 55   Temp 97.7 F (36.5 C)   Resp 11   SpO2 100%  Physical Exam Vitals and nursing note reviewed.  Constitutional:      General: She is not in acute distress.    Appearance: Normal appearance. She is well-developed and normal weight. She is not ill-appearing, toxic-appearing or diaphoretic.  HENT:     Head: Normocephalic and atraumatic.     Right Ear: External ear normal.     Left Ear: External ear normal.     Nose: Nose normal.     Mouth/Throat:     Mouth: Mucous membranes are moist.  Pharynx: Oropharynx is clear.  Eyes:     Extraocular Movements: Extraocular movements intact.     Conjunctiva/sclera: Conjunctivae normal.  Cardiovascular:     Rate and Rhythm: Normal rate and regular rhythm.     Heart sounds: No murmur heard. Pulmonary:     Effort: Pulmonary effort is normal. No respiratory distress.     Breath sounds: Normal breath sounds. No wheezing or rales.  Chest:     Chest wall: No tenderness.  Abdominal:     General: There is no distension.     Palpations: Abdomen is soft.     Tenderness: There is no abdominal tenderness.  Musculoskeletal:        General: Tenderness and signs of injury present. No swelling or deformity.     Cervical back: Normal range of motion  and neck supple.     Right lower leg: No edema.     Left lower leg: No edema.     Comments: ROM of left elbow and shoulder limited by pain  Skin:    General: Skin is warm and dry.     Capillary Refill: Capillary refill takes less than 2 seconds.  Neurological:     General: No focal deficit present.     Mental Status: She is alert and oriented to person, place, and time.     Cranial Nerves: No cranial nerve deficit.     Sensory: No sensory deficit.     Motor: No weakness.     Coordination: Coordination normal.  Psychiatric:        Mood and Affect: Mood normal.        Behavior: Behavior normal.        Thought Content: Thought content normal.        Judgment: Judgment normal.     ED Results / Procedures / Treatments   Labs (all labs ordered are listed, but only abnormal results are displayed) Labs Reviewed  COMPREHENSIVE METABOLIC PANEL - Abnormal; Notable for the following components:      Result Value   CO2 20 (*)    Creatinine, Ser 1.03 (*)    Calcium 8.5 (*)    Total Protein 6.1 (*)    GFR, Estimated 60 (*)    All other components within normal limits  CBC - Abnormal; Notable for the following components:   Hemoglobin 11.9 (*)    All other components within normal limits  URINALYSIS, ROUTINE W REFLEX MICROSCOPIC - Abnormal; Notable for the following components:   Ketones, ur 20 (*)    Leukocytes,Ua SMALL (*)    All other components within normal limits    EKG None  Radiology DG Ankle Right Port  Result Date: 12/19/2021 CLINICAL DATA:  Blunt trauma. EXAM: PORTABLE RIGHT ANKLE - 2 VIEW COMPARISON:  None Available. FINDINGS: There is no evidence of fracture, dislocation, or joint effusion. There is no evidence of arthropathy or other focal bone abnormality. Soft tissues are unremarkable. IMPRESSION: Negative. Electronically Signed   By: Marijo Conception M.D.   On: 12/19/2021 15:23   DG Knee Left Port  Result Date: 12/19/2021 CLINICAL DATA:  Blunt trauma. EXAM: PORTABLE  LEFT KNEE - 1-2 VIEW COMPARISON:  None Available. FINDINGS: No evidence of fracture, dislocation, or joint effusion. No evidence of arthropathy or other focal bone abnormality. Soft tissues are unremarkable. IMPRESSION: Negative. Electronically Signed   By: Marijo Conception M.D.   On: 12/19/2021 15:22   DG Knee Right Port  Result Date: 12/19/2021 CLINICAL DATA:  Blunt trauma. EXAM: PORTABLE RIGHT KNEE - 1-2 VIEW COMPARISON:  None Available. FINDINGS: No evidence of fracture, dislocation, or joint effusion. No evidence of arthropathy or other focal bone abnormality. Soft tissues are unremarkable. IMPRESSION: Negative. Electronically Signed   By: Marijo Conception M.D.   On: 12/19/2021 15:21   DG Elbow Complete Left  Result Date: 12/19/2021 CLINICAL DATA:  Blunt trauma. EXAM: LEFT ELBOW - COMPLETE 3+ VIEW COMPARISON:  None Available. FINDINGS: Abnormal anterior posterior fat pad displacement is noted suggesting underlying joint effusion. Minimally displaced radial head fracture is noted. IMPRESSION: Minimally displaced radial head fracture Electronically Signed   By: Marijo Conception M.D.   On: 12/19/2021 15:20   DG Shoulder Left Port  Result Date: 12/19/2021 CLINICAL DATA:  Trauma. EXAM: LEFT SHOULDER COMPARISON:  None Available. FINDINGS: There is no evidence of fracture or dislocation. There is no evidence of arthropathy or other focal bone abnormality. Soft tissues are unremarkable. IMPRESSION: Negative. Electronically Signed   By: Marijo Conception M.D.   On: 12/19/2021 15:18   DG Chest Port 1 View  Result Date: 12/19/2021 CLINICAL DATA:  Trauma. EXAM: PORTABLE CHEST 1 VIEW COMPARISON:  June 11, 2011. FINDINGS: The heart size and mediastinal contours are within normal limits. Both lungs are clear. The visualized skeletal structures are unremarkable. IMPRESSION: No active disease. Electronically Signed   By: Marijo Conception M.D.   On: 12/19/2021 15:17    Procedures Procedures    Medications Ordered  in ED Medications  Tdap (BOOSTRIX) injection 0.5 mL (0.5 mLs Intramuscular Given 12/19/21 1523)  acetaminophen (TYLENOL) tablet 650 mg (650 mg Oral Given 12/19/21 1523)  lactated ringers bolus 1,000 mL (0 mLs Intravenous Stopped 12/19/21 1650)  oxyCODONE-acetaminophen (PERCOCET/ROXICET) 5-325 MG per tablet 1 tablet (1 tablet Oral Given 12/19/21 1649)    ED Course/ Medical Decision Making/ A&P                           Medical Decision Making Amount and/or Complexity of Data Reviewed Labs: ordered. Radiology: ordered.  Risk OTC drugs. Prescription drug management.   This patient presents to the ED for concern of fall, this involves an extensive number of treatment options, and is a complaint that carries with it a high risk of complications and morbidity.  The differential diagnosis includes acute injuries   Co morbidities that complicate the patient evaluation  Anxiety, osteoporosis   Additional history obtained:  Additional history obtained from patient's daughter External records from outside source obtained and reviewed including EMR   Lab Tests:  I Ordered, and personally interpreted labs.  The pertinent results include: Baseline anemia, no leukocytosis, normal kidney function, normal electrolytes, no UTI   Imaging Studies ordered:  I ordered imaging studies including x-ray imaging of chest, left shoulder, left elbow, right knee, left knee, right ankle I independently visualized and interpreted imaging which showed mildly displaced radial head fracture with no other acute osseous or joint abnormalities I agree with the radiologist interpretation   Cardiac Monitoring: / EKG:  The patient was maintained on a cardiac monitor.  I personally viewed and interpreted the cardiac monitored which showed an underlying rhythm of: Sinus rhythm   Consultations Obtained:  I requested consultation with the orthopedic doctor, Dr. Zachery Dakins,  and discussed lab and imaging findings as  well as pertinent plan - they recommend: Long-arm posterior splint and close outpatient follow-up   Problem List / ED Course / Critical interventions /  Medication management  Patient is a pleasant 66 year old female presenting after a mechanical ground-level fall.  This fall was witnessed by her daughter, who accompanies her in the ED.  Patient states that she was in her normal state of health prior to the fall.  Mechanism of fall was described as rolling her right ankle causing her to fall to the ground.  As she fell, she landed on outstretched left arm.  Patient has since had pain in bilateral knees, left elbow, and left shoulder.  She describes a pain in the posterior aspect of her shoulder.  She has been experiencing some intermittent paresthesias in her left arm.  On exam, she does have tenderness to posterior left shoulder and left elbow.  Range of motion of these joints is limited by pain.  Distal sensation is intact.  Patient was given Tylenol for analgesia.  X-ray imaging of suspected areas of injury were ordered.  Patient underwent basic lab work given her episode of hypotension after the fall.  This was described by EMS as low BP and pallor on scene.  Patient was given 500 cc of IV fluid prior to arrival.  Patient reports resolution of lightheadedness and dizziness on arrival.  SBP on arrival is soft the patient states that that is normal for her.  Additional IV fluids were given.  Laboratory work-up was unremarkable.  I suspect that patient had a vagal episode following her fall due to the pain.  It was noted by EMS that her heart rate was low-normal at time of hypotension.  Patient given splint and sling.  She was given contact information for orthopedic follow-up.  She states that she will likely follow-up with her previous orthopedic surgeon.  She was discharged in stable condition. I ordered medication including Tylenol, Percocet for analgesia Reevaluation of the patient after these medicines  showed that the patient improved I have reviewed the patients home medicines and have made adjustments as needed   Social Determinants of Health:  Has access to outpatient care         Final Clinical Impression(s) / ED Diagnoses Final diagnoses:  Closed displaced fracture of head of left radius, initial encounter    Rx / DC Orders ED Discharge Orders          Ordered    oxyCODONE-acetaminophen (PERCOCET/ROXICET) 5-325 MG tablet  Every 6 hours PRN        12/19/21 1713              Godfrey Pick, MD 12/19/21 1719

## 2021-12-23 DIAGNOSIS — M25522 Pain in left elbow: Secondary | ICD-10-CM | POA: Diagnosis not present

## 2021-12-23 DIAGNOSIS — S52132A Displaced fracture of neck of left radius, initial encounter for closed fracture: Secondary | ICD-10-CM | POA: Insufficient documentation

## 2021-12-23 DIAGNOSIS — M67432 Ganglion, left wrist: Secondary | ICD-10-CM | POA: Diagnosis not present

## 2021-12-23 DIAGNOSIS — M25839 Other specified joint disorders, unspecified wrist: Secondary | ICD-10-CM | POA: Diagnosis not present

## 2021-12-23 DIAGNOSIS — S42402A Unspecified fracture of lower end of left humerus, initial encounter for closed fracture: Secondary | ICD-10-CM | POA: Diagnosis not present

## 2022-01-11 ENCOUNTER — Other Ambulatory Visit: Payer: Self-pay | Admitting: Orthopedic Surgery

## 2022-01-11 DIAGNOSIS — S52132A Displaced fracture of neck of left radius, initial encounter for closed fracture: Secondary | ICD-10-CM | POA: Diagnosis not present

## 2022-01-11 DIAGNOSIS — S42402A Unspecified fracture of lower end of left humerus, initial encounter for closed fracture: Secondary | ICD-10-CM | POA: Diagnosis not present

## 2022-01-11 DIAGNOSIS — D2362 Other benign neoplasm of skin of left upper limb, including shoulder: Secondary | ICD-10-CM | POA: Diagnosis not present

## 2022-01-11 DIAGNOSIS — R2232 Localized swelling, mass and lump, left upper limb: Secondary | ICD-10-CM | POA: Diagnosis not present

## 2022-01-25 DIAGNOSIS — Z4789 Encounter for other orthopedic aftercare: Secondary | ICD-10-CM | POA: Diagnosis not present

## 2022-01-25 DIAGNOSIS — S52132A Displaced fracture of neck of left radius, initial encounter for closed fracture: Secondary | ICD-10-CM | POA: Diagnosis not present

## 2022-03-03 ENCOUNTER — Ambulatory Visit: Payer: PPO | Admitting: Allergy

## 2022-03-18 ENCOUNTER — Encounter: Payer: Self-pay | Admitting: Allergy

## 2022-03-18 ENCOUNTER — Other Ambulatory Visit: Payer: Self-pay

## 2022-03-18 ENCOUNTER — Ambulatory Visit (INDEPENDENT_AMBULATORY_CARE_PROVIDER_SITE_OTHER): Payer: PPO | Admitting: Allergy

## 2022-03-18 VITALS — BP 130/70 | HR 63 | Temp 98.0°F | Resp 12 | Wt 147.6 lb

## 2022-03-18 DIAGNOSIS — T781XXD Other adverse food reactions, not elsewhere classified, subsequent encounter: Secondary | ICD-10-CM

## 2022-03-18 DIAGNOSIS — R0982 Postnasal drip: Secondary | ICD-10-CM | POA: Diagnosis not present

## 2022-03-18 DIAGNOSIS — J3089 Other allergic rhinitis: Secondary | ICD-10-CM | POA: Diagnosis not present

## 2022-03-18 NOTE — Progress Notes (Signed)
Follow-up Note  RE: Janice Vincent MRN: [id number] DOB: Apr 10, 1956 Date of Office Visit: 03/18/2022   History of present illness: Janice Vincent is a 66 y.o. female presenting today for follow-up of allergic rhinitis postnasal drip with every 3 reaction.  She was last seen in the office on  10/23/2021 by myself. She fell and broke her L (dominant) elbow on Labor day weekend walking down the street in flip flops and only required casting.    She states this summer was worse than fall and worse than previous summers with her allergies.  She states she stayed congested.  She states the atrovent dries her out too much; if used it twice a day she would wake up super dry.  She states she tried experimenting with the Atrovent dosing.  She states the flonase seem to be more effective and would use atrovent as needed.  She states she hasn't used anything today and she has had more congestion and drainage.  She will take Zyrtec as needed.  She does feel Zyrtec to be effective when used. She does her best to avoid yeast and barley in breads.  She will eat bread products in moderation.  She does have access to an epinephrine device that she has not needed to use.  Review of systems: Review of Systems  Constitutional: Negative.   HENT:  Positive for congestion and postnasal drip.   Eyes: Negative.   Respiratory: Negative.    Cardiovascular: Negative.   Gastrointestinal: Negative.   Musculoskeletal: Negative.   Skin: Negative.   Allergic/Immunologic: Negative.   Neurological: Negative.      All other systems negative unless noted above in HPI  Past medical/social/surgical/family history have been reviewed and are unchanged unless specifically indicated below.  No changes  Medication List: Current Outpatient Medications  Medication Sig Dispense Refill   azelastine (ASTELIN) 0.1 % nasal spray Use 2 sprays per nostril 2 times daily for nasal drainage/throat clearing control. 30 mL 5    cetirizine (ZYRTEC) 10 MG tablet Take 10 mg by mouth daily as needed. Seasonal allergies     EPINEPHrine 0.3 mg/0.3 mL IJ SOAJ injection SMARTSIG:0.3 Milligram(s) IM Once     fluticasone (FLONASE) 50 MCG/ACT nasal spray Place 1 spray into both nostrils daily as needed. For seasonal allergies 16 g 11   ipratropium (ATROVENT) 0.06 % nasal spray Place 2 sprays into both nostrils 4 (four) times daily. 15 mL 5   metoprolol succinate (TOPROL-XL) 25 MG 24 hr tablet TAKE 1 TABLET(25 MG) BY MOUTH DAILY 90 tablet 3   No current facility-administered medications for this visit.     Known medication allergies: Allergies  Allergen Reactions   Sulfonamide Derivatives     unknown     Physical examination: Blood pressure 130/70, pulse 63, temperature 98 F (36.7 C), resp. rate 12, weight 147 lb 9.6 oz (67 kg), SpO2 95 %.  General: Alert, interactive, in no acute distress. HEENT: PERRLA, TMs pearly gray, turbinates minimally edematous without discharge, post-pharynx non erythematous. Neck: Supple without lymphadenopathy. Lungs: Clear to auscultation without wheezing, rhonchi or rales. {no increased work of breathing. CV: Normal S1, S2 without murmurs. Abdomen: Nondistended, nontender. Skin: Warm and dry, without lesions or rashes. Extremities:  No clubbing, cyanosis or edema. Neuro:   Grossly intact.   Assessment and plan: Allergic rhinitis with postnasal drip Adverse food reaction  - Continue avoidance measures for grasses, weeds, trees, indoor molds, outdoor molds, dust mites, and cat. - Continue with: Zyrtec (  cetirizine) '10mg'$  tablet once daily as needed. Flonase 2 sprays each nostril daily for 1-2 weeks at a time before stopping once nasal congestion improves for maximum benefit.  Atrovent 2 sprays per nostril  as needed for nasal drainage/throat clearing control.     - You can use an extra dose of the antihistamine, if needed, for breakthrough symptoms.  - Consider nasal saline rinses 1-2  times daily to remove allergens from the nasal cavities as well as help with mucous clearance (this is especially helpful to do before the nasal sprays are given) - Consider allergy shots as a means of long-term control. - Allergy shots "re-train" and "reset" the immune system to ignore environmental allergens and decrease the resulting immune response to those allergens (sneezing, itchy watery eyes, runny nose, nasal congestion, etc).    - Allergy shots improve symptoms in 80-85% of patients.   - Food allergy testing was positive to barley and bakers yeast.   Continue to monitor for symptoms after eating barely or yeast containing foods.  If you are having symptoms with ingestion of these foods then remove from diet.  If you are not having symptoms after eating these foods then you are sensitized only and can keep in diet.   - Due to positive food testing have access to self-injectable Epipen 0.'3mg'$  - Follow emergency action plan in case of allergic reaction    Allergy: food allergy is when you have eaten a food, developed an allergic reaction after eating the food and have IgE to the food (positive food testing either by skin testing or blood testing).  Food allergy could lead to life threatening symptoms  Sensitivity: occurs when you have IgE to a food (positive food testing either by skin testing or blood testing) but is a food you eat without any issues.  This is not an allergy and we recommend keeping the food in the diet  Intolerance: this is when you have negative testing by either skin testing or blood testing thus not allergic but the food causes symptoms (like belly pain, bloating, diarrhea etc) with ingestion.  These foods should be avoided to prevent symptoms.    Follow-up in 6 months or sooner if needed  I appreciate the opportunity to take part in Taelar's care. Please do not hesitate to contact me with questions.  Sincerely,   Prudy Feeler, MD Allergy/Immunology [facility]

## 2022-03-18 NOTE — Patient Instructions (Addendum)
-   Continue avoidance measures for grasses, weeds, trees, indoor molds, outdoor molds, dust mites, and cat. - Continue with: Zyrtec (cetirizine) '10mg'$  tablet once daily as needed. Flonase 2 sprays each nostril daily for 1-2 weeks at a time before stopping once nasal congestion improves for maximum benefit.  Atrovent 2 sprays per nostril  as needed for nasal drainage/throat clearing control.     - You can use an extra dose of the antihistamine, if needed, for breakthrough symptoms.  - Consider nasal saline rinses 1-2 times daily to remove allergens from the nasal cavities as well as help with mucous clearance (this is especially helpful to do before the nasal sprays are given) - Consider allergy shots as a means of long-term control. - Allergy shots "re-train" and "reset" the immune system to ignore environmental allergens and decrease the resulting immune response to those allergens (sneezing, itchy watery eyes, runny nose, nasal congestion, etc).    - Allergy shots improve symptoms in 80-85% of patients.   - Food allergy testing was positive to barley and bakers yeast.   Continue to monitor for symptoms after eating barely or yeast containing foods.  If you are having symptoms with ingestion of these foods then remove from diet.  If you are not having symptoms after eating these foods then you are sensitized only and can keep in diet.   - Due to positive food testing have access to self-injectable Epipen 0.'3mg'$  - Follow emergency action plan in case of allergic reaction    Allergy: food allergy is when you have eaten a food, developed an allergic reaction after eating the food and have IgE to the food (positive food testing either by skin testing or blood testing).  Food allergy could lead to life threatening symptoms  Sensitivity: occurs when you have IgE to a food (positive food testing either by skin testing or blood testing) but is a food you eat without any issues.  This is not an allergy and we  recommend keeping the food in the diet  Intolerance: this is when you have negative testing by either skin testing or blood testing thus not allergic but the food causes symptoms (like belly pain, bloating, diarrhea etc) with ingestion.  These foods should be avoided to prevent symptoms.    Follow-up in 6 months or sooner if needed

## 2022-04-29 ENCOUNTER — Ambulatory Visit (INDEPENDENT_AMBULATORY_CARE_PROVIDER_SITE_OTHER): Payer: PPO | Admitting: Family Medicine

## 2022-04-29 ENCOUNTER — Encounter: Payer: Self-pay | Admitting: Family Medicine

## 2022-04-29 VITALS — BP 110/66 | HR 61 | Temp 97.4°F | Ht 64.0 in | Wt 147.2 lb

## 2022-04-29 DIAGNOSIS — Z0000 Encounter for general adult medical examination without abnormal findings: Secondary | ICD-10-CM

## 2022-04-29 DIAGNOSIS — R7989 Other specified abnormal findings of blood chemistry: Secondary | ICD-10-CM | POA: Diagnosis not present

## 2022-04-29 DIAGNOSIS — E785 Hyperlipidemia, unspecified: Secondary | ICD-10-CM | POA: Diagnosis not present

## 2022-04-29 DIAGNOSIS — Z23 Encounter for immunization: Secondary | ICD-10-CM

## 2022-04-29 DIAGNOSIS — I4719 Other supraventricular tachycardia: Secondary | ICD-10-CM

## 2022-04-29 DIAGNOSIS — J3089 Other allergic rhinitis: Secondary | ICD-10-CM | POA: Diagnosis not present

## 2022-04-29 DIAGNOSIS — D649 Anemia, unspecified: Secondary | ICD-10-CM

## 2022-04-29 DIAGNOSIS — Z13820 Encounter for screening for osteoporosis: Secondary | ICD-10-CM | POA: Diagnosis not present

## 2022-04-29 NOTE — Progress Notes (Signed)
[facility] [address] [place] [address] Dept: [phone] Dept Fax: [phone]  Annual Physical Visit  Subjective:    Patient ID: Janice Vincent, female    DOB: [birth year], 67 y.o..   MRN: [id number]  Chief Complaint  Patient presents with   Annual Exam    CPE/labs.   No concerns.  Not fasting.     History of Present Illness:  Patient is in today for an annual physical/preventative visit.  [name] notes she is doing well overall. She did have a fractured left elbow earlier int he year. She feels she has recovered form this. She also did have excision of a lesion on her left forearm. 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  [name] has a history of PAT. She is currently managed on metoprolol and finds this has done well to control her palpitations. She has a past history of a multinodular goiter. This summer she ahd some thyroid testing that showed a mildly elevated TSH, but with normal T4. She has since had follow up testing by her GYN.   [name] has a history of seasonal allergic rhinitis. She manages this with Flonase and Zyrtec. She has azelastine and Atrovent available if she needs them.   Review of Systems  Constitutional:  Negative for chills, diaphoresis, fever, malaise/fatigue and weight loss.  HENT:  Negative for congestion, ear pain, hearing loss, sinus pain and sore throat.   Eyes:  Negative for blurred vision, pain, discharge and redness.  Respiratory:  Negative for cough, shortness of breath and wheezing.   Cardiovascular:  Positive for palpitations. Negative for chest pain and leg swelling.       History of PAT. Managed with metoprolol 25 mg daily. Has occasional flare when she misses her meds.  Gastrointestinal:  Negative for abdominal pain, constipation, diarrhea, heartburn, nausea and vomiting.  Genitourinary: Negative.   Musculoskeletal:  Negative for back pain, joint pain and myalgias.   Skin:  Negative for itching and rash.  Psychiatric/Behavioral:  Negative for depression. The patient is not nervous/anxious.    Past Medical History: Patient Active Problem List   Diagnosis Date Noted   Closed fracture of neck of left radius 12/23/2021   PAT (paroxysmal atrial tachycardia) 03/24/2021   Hoarseness 02/25/2014   Allergic rhinitis 03/27/2009   History of colon polyps 01/31/2008   Diverticulosis of colon 01/08/2008   Multinodular goiter 12/29/2007   Mild intermittent asthma 02/18/2007   Irritable bowel syndrome with diarrhea 02/18/2007   Past Surgical History:  Procedure Laterality Date   breast biospy  1981   benign   growth removed from left foot 5th digit  2000   SKIN LESION EXCISION     multiple moles   Family History  Adopted: Yes  Problem Relation Age of Onset   Alcoholism Mother    Heart failure Father        now age 11   Macular degeneration Father    Celiac disease Father    Dementia Father    Atrial fibrillation Half-Brother    Outpatient Medications Prior to Visit  Medication Sig Dispense Refill   azelastine (ASTELIN) 0.1 % nasal spray Use 2 sprays per nostril 2 times daily for nasal drainage/throat clearing control. 30 mL 5   cetirizine (ZYRTEC) 10 MG tablet Take 10 mg by mouth daily as needed. Seasonal allergies     EPINEPHrine 0.3 mg/0.3 mL IJ SOAJ injection SMARTSIG:0.3 Milligram(s) IM Once  fluticasone (FLONASE) 50 MCG/ACT nasal spray Place 1 spray into both nostrils daily as needed. For seasonal allergies 16 g 11   ipratropium (ATROVENT) 0.06 % nasal spray Place 2 sprays into both nostrils 4 (four) times daily. 15 mL 5   metoprolol succinate (TOPROL-XL) 25 MG 24 hr tablet TAKE 1 TABLET(25 MG) BY MOUTH DAILY 90 tablet 3   No facility-administered medications prior to visit.   Allergies  Allergen Reactions   Sulfonamide Derivatives     unknown    Objective:   Today's Vitals   04/29/22 0859  BP: 110/66  Pulse: 61  Temp: (!) 97.4  F (36.3 C)  TempSrc: Temporal  SpO2: 94%  Weight: 147 lb 3.2 oz (66.8 kg)  Height: '5\' 4"'$  (1.626 m)   Body mass index is 25.27 kg/m.   General: Well developed, well nourished. No acute distress. HEENT: Normocephalic, non-traumatic. PERRL, EOMI. Conjunctiva clear. External ears normal. EAC and   TMs normal bilaterally. Nose clear without congestion or rhinorrhea. Mucous membranes moist.   Oropharynx clear. Good dentition. Neck: Supple. No lymphadenopathy. No thyromegaly. Lungs: Clear to auscultation bilaterally. No wheezing, rales or rhonchi. CV: RRR without murmurs or rubs. Pulses 2+ bilaterally. Abdomen: Soft, non-tender. Bowel sounds positive, normal pitch and frequency. No hepatosplenomegaly.   No rebound or guarding. Extremities: Full ROM. No joint swelling or tenderness. No edema noted. Skin: Warm and dry. No rashes. Psych: Alert and oriented. Normal mood and affect.  Health Maintenance Due  Topic Date Due   Pneumonia Vaccine 25+ Years old (1 - PCV) Never done   COVID-19 Vaccine (7 - 2023-24 season) 03/29/2022     Lab Results:    Latest Ref Rng & Units 12/19/2021    3:24 PM 03/11/2021   10:12 AM 12/12/2014    4:00 PM  CBC  WBC 4.0 - 10.5 K/uL 10.3  4.7  5.6   Hemoglobin 12.0 - 15.0 g/dL 11.9  12.0  12.8   Hematocrit 36.0 - 46.0 % 36.1  36.0  37.5   Platelets 150 - 400 K/uL 269  307  281.0       Latest Ref Rng & Units 12/19/2021    3:24 PM 03/11/2021   10:11 AM 12/12/2014    4:00 PM  CMP  Glucose 70 - 99 mg/dL 94  89  91   BUN 8 - 23 mg/dL '11  13  17   '$ Creatinine 0.44 - 1.00 mg/dL 1.03  0.97  0.79   Sodium 135 - 145 mmol/L 137  138  137   Potassium 3.5 - 5.1 mmol/L 3.7  4.6  4.3   Chloride 98 - 111 mmol/L 107  102  103   CO2 22 - 32 mmol/L '20  23  28   '$ Calcium 8.9 - 10.3 mg/dL 8.5  9.1  9.5   Total Protein 6.5 - 8.1 g/dL 6.1   7.3   Total Bilirubin 0.3 - 1.2 mg/dL 0.7   0.5   Alkaline Phos 38 - 126 U/L 58   73   AST 15 - 41 U/L 15   12   ALT 0 - 44 U/L 17   18     Assessment & Plan:   1. Annual physical exam Overall excellent health. Reviewed recommended screenings and immunizations. Recommend she incorporate daily exercise, esp. when she moves into retirement.  2. PAT (paroxysmal atrial tachycardia) Stable. Continue metoprolol succinate 25 mg daily.  3. Non-seasonal allergic rhinitis due to other allergic trigger Continue current allergy regimen  of cetirizine 10 mg daily and Flonase nasal spray.  4. Anemia, unspecified type Minor low Hgb earlier this year. I will reassess.  - CBC  5. Elevated serum creatinine Mild elevation go creatinine on last chemistry panel. I will reassess.  - Basic metabolic panel  6. Screening for osteoporosis Plan for bone density after June.  - DG Bone Density; Future  7. Need for pneumococcal 20-valent conjugate vaccination  - Pneumococcal conjugate vaccine 20-valent (Prevnar 20)  8. Borderline hyperlipidemia Rep[eat lipids today.  - Lipid panel   Return in about 1 year (around 04/30/2023) for Annual preventative care.   Haydee Salter, MD

## 2022-05-03 ENCOUNTER — Other Ambulatory Visit (INDEPENDENT_AMBULATORY_CARE_PROVIDER_SITE_OTHER): Payer: PPO

## 2022-05-03 DIAGNOSIS — E785 Hyperlipidemia, unspecified: Secondary | ICD-10-CM | POA: Diagnosis not present

## 2022-05-03 DIAGNOSIS — R7989 Other specified abnormal findings of blood chemistry: Secondary | ICD-10-CM

## 2022-05-03 DIAGNOSIS — D649 Anemia, unspecified: Secondary | ICD-10-CM

## 2022-05-03 LAB — LIPID PANEL
Cholesterol: 193 mg/dL (ref 0–200)
HDL: 53.1 mg/dL (ref >39.00)
LDL Cholesterol: 122 mg/dL — ABNORMAL HIGH (ref 0–99)
NonHDL: 140.08
Total CHOL/HDL Ratio: 4
Triglycerides: 92 mg/dL (ref 0.0–149.0)
VLDL: 18.4 mg/dL (ref 0.0–40.0)

## 2022-05-03 LAB — CBC
HCT: 38.8 % (ref 36.0–46.0)
Hemoglobin: 13.3 g/dL (ref 12.0–15.0)
MCHC: 34.2 g/dL (ref 30.0–36.0)
MCV: 86.4 fl (ref 78.0–100.0)
Platelets: 287 10*3/uL (ref 150.0–400.0)
RBC: 4.49 Mil/uL (ref 3.87–5.11)
RDW: 12.7 % (ref 11.5–15.5)
WBC: 5.1 10*3/uL (ref 4.0–10.5)

## 2022-05-03 LAB — BASIC METABOLIC PANEL
BUN: 15 mg/dL (ref 6–23)
CO2: 26 mEq/L (ref 19–32)
Calcium: 9.3 mg/dL (ref 8.4–10.5)
Chloride: 103 mEq/L (ref 96–112)
Creatinine, Ser: 0.93 mg/dL (ref 0.40–1.20)
GFR: 63.47 mL/min (ref >60.00)
Glucose, Bld: 99 mg/dL (ref 70–99)
Potassium: 4.1 mEq/L (ref 3.5–5.1)
Sodium: 137 mEq/L (ref 135–145)

## 2022-05-04 ENCOUNTER — Other Ambulatory Visit: Payer: PPO

## 2022-08-09 ENCOUNTER — Ambulatory Visit (INDEPENDENT_AMBULATORY_CARE_PROVIDER_SITE_OTHER): Payer: PPO

## 2022-08-09 VITALS — Ht 64.0 in | Wt 140.0 lb

## 2022-08-09 DIAGNOSIS — Z0000 Encounter for general adult medical examination without abnormal findings: Secondary | ICD-10-CM | POA: Diagnosis not present

## 2022-08-09 NOTE — Patient Instructions (Signed)
Janice Vincent , Thank you for taking time to come for your Medicare Wellness Visit. I appreciate your ongoing commitment to your health goals. Please review the following plan we discussed and let me know if I can assist you in the future.   These are the goals we discussed:  Goals      Patient Stated     08/09/2022, wants to start walking        This is a list of the screening recommended for you and due dates:  Health Maintenance  Topic Date Due   COVID-19 Vaccine (7 - 2023-24 season) 03/29/2022   Mammogram  10/31/2022   Flu Shot  11/18/2022   Colon Cancer Screening  05/08/2023   Medicare Annual Wellness Visit  08/09/2023   DTaP/Tdap/Td vaccine (3 - Td or Tdap) 12/20/2031   Pneumonia Vaccine  Completed   DEXA scan (bone density measurement)  Completed   Hepatitis C Screening: USPSTF Recommendation to screen - Ages 49-79 yo.  Completed   Zoster (Shingles) Vaccine  Completed   HPV Vaccine  Aged Out    Advanced directives: Please bring a copy of your POA (Power of [name]) and/or Living Will to your next appointment.   Conditions/risks identified: none  Next appointment: Follow up in one year for your annual wellness visit    Preventive Care 65 Years and Older, Female Preventive care refers to lifestyle choices and visits with your health care provider that can promote health and wellness. What does preventive care include? A yearly physical exam. This is also called an annual well check. Dental exams once or twice a year. Routine eye exams. Ask your health care provider how often you should have your eyes checked. Personal lifestyle choices, including: Daily care of your teeth and gums. Regular physical activity. Eating a healthy diet. Avoiding tobacco and drug use. Limiting alcohol use. Practicing safe sex. Taking low-dose aspirin every day. Taking vitamin and mineral supplements as recommended by your health care provider. What happens during an annual well  check? The services and screenings done by your health care provider during your annual well check will depend on your age, overall health, lifestyle risk factors, and family history of disease. Counseling  Your health care provider may ask you questions about your: Alcohol use. Tobacco use. Drug use. Emotional well-being. Home and relationship well-being. Sexual activity. Eating habits. History of falls. Memory and ability to understand (cognition). Work and work Astronomer. Reproductive health. Screening  You may have the following tests or measurements: Height, weight, and BMI. Blood pressure. Lipid and cholesterol levels. These may be checked every 5 years, or more frequently if you are over 70 years old. Skin check. Lung cancer screening. You may have this screening every year starting at age 70 if you have a 30-pack-year history of smoking and currently smoke or have quit within the past 15 years. Fecal occult blood test (FOBT) of the stool. You may have this test every year starting at age 8. Flexible sigmoidoscopy or colonoscopy. You may have a sigmoidoscopy every 5 years or a colonoscopy every 10 years starting at age 74. Hepatitis C blood test. Hepatitis B blood test. Sexually transmitted disease (STD) testing. Diabetes screening. This is done by checking your blood sugar (glucose) after you have not eaten for a while (fasting). You may have this done every 1-3 years. Bone density scan. This is done to screen for osteoporosis. You may have this done starting at age 56. Mammogram. This may be done every  1-2 years. Talk to your health care provider about how often you should have regular mammograms. Talk with your health care provider about your test results, treatment options, and if necessary, the need for more tests. Vaccines  Your health care provider may recommend certain vaccines, such as: Influenza vaccine. This is recommended every year. Tetanus, diphtheria, and  acellular pertussis (Tdap, Td) vaccine. You may need a Td booster every 10 years. Zoster vaccine. You may need this after age 59. Pneumococcal 13-valent conjugate (PCV13) vaccine. One dose is recommended after age 12. Pneumococcal polysaccharide (PPSV23) vaccine. One dose is recommended after age 31. Talk to your health care provider about which screenings and vaccines you need and how often you need them. This information is not intended to replace advice given to you by your health care provider. Make sure you discuss any questions you have with your health care provider. Document Released: 05/02/2015 Document Revised: 12/24/2015 Document Reviewed: 02/04/2015 Elsevier Interactive Patient Education  2017 [employer] Prevention in the Home Falls can cause injuries. They can happen to people of all ages. There are many things you can do to make your home safe and to help prevent falls. What can I do on the outside of my home? Regularly fix the edges of walkways and driveways and fix any cracks. Remove anything that might make you trip as you walk through a door, such as a raised step or threshold. Trim any bushes or trees on the path to your home. Use bright outdoor lighting. Clear any walking paths of anything that might make someone trip, such as rocks or tools. Regularly check to see if handrails are loose or broken. Make sure that both sides of any steps have handrails. Any raised decks and porches should have guardrails on the edges. Have any leaves, snow, or ice cleared regularly. Use sand or salt on walking paths during winter. Clean up any spills in your garage right away. This includes oil or grease spills. What can I do in the bathroom? Use night lights. Install grab bars by the toilet and in the tub and shower. Do not use towel bars as grab bars. Use non-skid mats or decals in the tub or shower. If you need to sit down in the shower, use a plastic, non-slip stool. Keep  the floor dry. Clean up any water that spills on the floor as soon as it happens. Remove soap buildup in the tub or shower regularly. Attach bath mats securely with double-sided non-slip rug tape. Do not have throw rugs and other things on the floor that can make you trip. What can I do in the bedroom? Use night lights. Make sure that you have a light by your bed that is easy to reach. Do not use any sheets or blankets that are too big for your bed. They should not hang down onto the floor. Have a firm chair that has side arms. You can use this for support while you get dressed. Do not have throw rugs and other things on the floor that can make you trip. What can I do in the kitchen? Clean up any spills right away. Avoid walking on wet floors. Keep items that you use a lot in easy-to-reach places. If you need to reach something above you, use a strong step stool that has a grab bar. Keep electrical cords out of the way. Do not use floor polish or wax that makes floors slippery. If you must use wax, use non-skid  floor wax. Do not have throw rugs and other things on the floor that can make you trip. What can I do with my stairs? Do not leave any items on the stairs. Make sure that there are handrails on both sides of the stairs and use them. Fix handrails that are broken or loose. Make sure that handrails are as long as the stairways. Check any carpeting to make sure that it is firmly attached to the stairs. Fix any carpet that is loose or worn. Avoid having throw rugs at the top or bottom of the stairs. If you do have throw rugs, attach them to the floor with carpet tape. Make sure that you have a light switch at the top of the stairs and the bottom of the stairs. If you do not have them, ask someone to add them for you. What else can I do to help prevent falls? Wear shoes that: Do not have high heels. Have rubber bottoms. Are comfortable and fit you well. Are closed at the toe. Do not  wear sandals. If you use a stepladder: Make sure that it is fully opened. Do not climb a closed stepladder. Make sure that both sides of the stepladder are locked into place. Ask someone to hold it for you, if possible. Clearly mark and make sure that you can see: Any grab bars or handrails. First and last steps. Where the edge of each step is. Use tools that help you move around (mobility aids) if they are needed. These include: Canes. Walkers. Scooters. Crutches. Turn on the lights when you go into a dark area. Replace any light bulbs as soon as they burn out. Set up your furniture so you have a clear path. Avoid moving your furniture around. If any of your floors are uneven, fix them. If there are any pets around you, be aware of where they are. Review your medicines with your doctor. Some medicines can make you feel dizzy. This can increase your chance of falling. Ask your doctor what other things that you can do to help prevent falls. This information is not intended to replace advice given to you by your health care provider. Make sure you discuss any questions you have with your health care provider. Document Released: 01/30/2009 Document Revised: 09/11/2015 Document Reviewed: 05/10/2014 Elsevier Interactive Patient Education  2017 [employer].
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
## 2022-09-14 ENCOUNTER — Other Ambulatory Visit: Payer: Self-pay | Admitting: Allergy

## 2022-09-16 ENCOUNTER — Encounter: Payer: Self-pay | Admitting: Allergy

## 2022-09-16 ENCOUNTER — Other Ambulatory Visit: Payer: Self-pay

## 2022-09-16 ENCOUNTER — Ambulatory Visit (INDEPENDENT_AMBULATORY_CARE_PROVIDER_SITE_OTHER): Payer: PPO | Admitting: Allergy

## 2022-09-16 VITALS — BP 102/74 | HR 64 | Temp 98.5°F | Ht 63.39 in | Wt 145.4 lb

## 2022-09-16 DIAGNOSIS — J018 Other acute sinusitis: Secondary | ICD-10-CM | POA: Diagnosis not present

## 2022-09-16 DIAGNOSIS — T781XXD Other adverse food reactions, not elsewhere classified, subsequent encounter: Secondary | ICD-10-CM | POA: Diagnosis not present

## 2022-09-16 DIAGNOSIS — J3089 Other allergic rhinitis: Secondary | ICD-10-CM | POA: Diagnosis not present

## 2022-09-16 MED ORDER — AMOXICILLIN-POT CLAVULANATE 875-125 MG PO TABS: 1.0000 | ORAL_TABLET | Freq: Two times a day (BID) | ORAL | 0 refills | Status: AC

## 2022-09-16 MED ORDER — PREDNISONE 20 MG PO TABS: 20.0000 mg | ORAL_TABLET | Freq: Every day | ORAL | 0 refills | Status: AC

## 2022-09-16 MED ORDER — IPRATROPIUM BROMIDE 0.06 % NA SOLN
NASAL | 5 refills | Status: DC
Start: 2022-09-16 — End: 2023-08-19

## 2022-09-16 MED ORDER — ALBUTEROL SULFATE HFA 108 (90 BASE) MCG/ACT IN AERS: 2.0000 | INHALATION_SPRAY | RESPIRATORY_TRACT | 1 refills | Status: DC | PRN

## 2022-09-16 MED ORDER — FLUTICASONE PROPIONATE 50 MCG/ACT NA SUSP
NASAL | 5 refills | Status: DC
Start: 2022-09-16 — End: 2023-08-19

## 2022-09-16 MED ORDER — EPINEPHRINE 0.3 MG/0.3ML IJ SOAJ: 0.3000 mg | INTRAMUSCULAR | 1 refills | Status: DC | PRN

## 2022-09-16 NOTE — Progress Notes (Signed)
Follow-up Note  RE: Janice Vincent MRN: [id number] DOB: [birth year] Date of Office Visit: 09/16/2022   History of present illness: Janice Vincent is a 67 y.o. female presenting today for follow-up of allergic rhinitis with PND and adverse food reaction. She was last seen in the office on 03/18/22 by myself.   She states she has been having cough, increase nasal drainage, ears stopped up.  She reports having a bad frontal headache Saturday and Sunday this week.  She did start taking Mucinex. She states her temp has been elevated from her resting baseline temp.  Up until this weekend she was taking zyrtec and nasal atrovent which was helping with her regular allergy control. She has an albuterol inhaler but states has not needed to use it but she did not get it refilled in case she may need to use it The nasal atrovent/astelin helps some.  Zyrtec at this time not really sure if it is very effective.  She does try to eat gluten in the diet very minimally.  She does have an epinephrine device.  Review of systems: Review of Systems  Constitutional: Negative.   HENT:         See HPI  Eyes: Negative.   Respiratory:         See HPI  Cardiovascular: Negative.   Gastrointestinal: Negative.   Musculoskeletal: Negative.   Skin: Negative.   Allergic/Immunologic: Negative.   Neurological:        See HPI     All other systems negative unless noted above in HPI  Past medical/social/surgical/family history have been reviewed and are unchanged unless specifically indicated below.  No changes  Medication List: Current Outpatient Medications  Medication Sig Dispense Refill   amoxicillin-clavulanate (AUGMENTIN) 875-125 MG tablet Take 1 tablet by mouth 2 (two) times daily for 10 days. 20 tablet 0   azelastine (ASTELIN) 0.1 % nasal spray USE 2 SPRAYS PER NOSTRIL TWICE DAILY FOR NASAL DRAINAGE/THROAT CLEARING CONTROL 30 mL 5   cetirizine (ZYRTEC) 10 MG tablet Take 10 mg by mouth daily as needed.  Seasonal allergies     EPINEPHrine (EPIPEN 2-PAK) 0.3 mg/0.3 mL IJ SOAJ injection Inject 0.3 mg into the muscle as needed for anaphylaxis. 0.3 mL 1   EPINEPHrine 0.3 mg/0.3 mL IJ SOAJ injection SMARTSIG:0.3 Milligram(s) IM Once     metoprolol succinate (TOPROL-XL) 25 MG 24 hr tablet TAKE 1 TABLET(25 MG) BY MOUTH DAILY 90 tablet 3   predniSONE (DELTASONE) 20 MG tablet Take 1 tablet (20 mg total) by mouth daily with breakfast for 5 days. 5 tablet 0   albuterol (VENTOLIN HFA) 108 (90 Base) MCG/ACT inhaler Inhale 2 puffs into the lungs every 4 (four) hours as needed. 18 g 1   fluticasone (FLONASE) 50 MCG/ACT nasal spray 2 sprays each nostril daily for 1-2 weeks at a time before stopping once nasal congestion improves. 16 g 5   ipratropium (ATROVENT) 0.06 % nasal spray 2 sprays per nostril as needed for nasal drainage/ throat clearing control 15 mL 5   No current facility-administered medications for this visit.     Known medication allergies: Allergies  Allergen Reactions   Sulfonamide Derivatives     unknown     Physical examination: Blood pressure 102/74, pulse 64, temperature 98.5 F (36.9 C), temperature source Temporal, height 5' 3.39" (1.61 m), weight 145 lb 6.4 oz (66 kg), SpO2 96 %.  General: Alert, interactive, in no acute distress. HEENT: PERRLA, TMs pearly gray, turbinates moderately  edematous with clear discharge, post-pharynx non erythematous. Neck: Supple without lymphadenopathy. Lungs: Clear to auscultation without wheezing, rhonchi or rales. {no increased work of breathing. CV: Normal S1, S2 without murmurs. Abdomen: Nondistended, nontender. Skin: Warm and dry, without lesions or rashes. Extremities:  No clubbing, cyanosis or edema. Neuro:   Grossly intact.  Diagnositics/Labs: None today   Assessment and plan: Acute sinusitis - Treat current acute sinusitis with Augmentin 875mg  1 tab twice a day for 10 days and Prednisone 20mg  x 5 days.  - Continue Mucinex with  plenty of water to help thin mucus. - Get adequate rest and stay well hydrated.  - Have access to albuterol inhaler 2 puffs every 4-6 hours as needed for cough/wheeze/shortness of breath/chest tightness.  May use 15-20 minutes prior to activity.   Monitor frequency of use.    Allergic rhinitis with conjunctivitis - Continue avoidance measures for grasses, weeds, trees, indoor molds, outdoor molds, dust mites, and cat. - Continue with: Zyrtec (cetirizine) 10mg  tablet once daily as needed. Flonase 2 sprays each nostril daily for 1-2 weeks at a time before stopping once nasal congestion improves for maximum benefit.  Atrovent 2 sprays per nostril  as needed for nasal drainage/throat clearing control.     - You can use an extra dose of the antihistamine, if needed, for breakthrough symptoms.  - Consider nasal saline rinses 1-2 times daily to remove allergens from the nasal cavities as well as help with mucous clearance (this is especially helpful to do before the nasal sprays are given) - Consider allergy shots as a means of long-term control. - Allergy shots "re-train" and "reset" the immune system to ignore environmental allergens and decrease the resulting immune response to those allergens (sneezing, itchy watery eyes, runny nose, nasal congestion, etc).    - Allergy shots improve symptoms in 80-85% of patients.   Food allergy - Food allergy testing was positive to barley and bakers yeast.   Continue to monitor for symptoms after eating barely or yeast containing foods.  If you are having symptoms with ingestion of these foods then remove from diet.  If you are not having symptoms after eating these foods then you are sensitized only and can keep in diet.   - Due to positive food testing have access to self-injectable Epipen 0.3mg  - Follow emergency action plan in case of allergic reaction    Allergy: food allergy is when you have eaten a food, developed an allergic reaction after eating the food  and have IgE to the food (positive food testing either by skin testing or blood testing).  Food allergy could lead to life threatening symptoms  Sensitivity: occurs when you have IgE to a food (positive food testing either by skin testing or blood testing) but is a food you eat without any issues.  This is not an allergy and we recommend keeping the food in the diet  Intolerance: this is when you have negative testing by either skin testing or blood testing thus not allergic but the food causes symptoms (like belly pain, bloating, diarrhea etc) with ingestion.  These foods should be avoided to prevent symptoms.    Follow-up in 6 months or sooner if needed  I appreciate the opportunity to take part in Janice Vincent's care. Please do not hesitate to contact me with questions.  Sincerely,   Margo Aye, MD Allergy/Immunology [facility]

## 2022-09-16 NOTE — Patient Instructions (Addendum)
-   Treat current acute sinusitis with Augmentin 875mg  1 tab twice a day for 10 days and Prednisone 20mg  x 5 days.  - Continue Mucinex with plenty of water to help thin mucus. - Get adequate rest and stay well hydrated.   - Continue avoidance measures for grasses, weeds, trees, indoor molds, outdoor molds, dust mites, and cat. - Continue with: Zyrtec (cetirizine) 10mg  tablet once daily as needed. Flonase 2 sprays each nostril daily for 1-2 weeks at a time before stopping once nasal congestion improves for maximum benefit.  Atrovent 2 sprays per nostril  as needed for nasal drainage/throat clearing control.     - You can use an extra dose of the antihistamine, if needed, for breakthrough symptoms.  - Consider nasal saline rinses 1-2 times daily to remove allergens from the nasal cavities as well as help with mucous clearance (this is especially helpful to do before the nasal sprays are given) - Consider allergy shots as a means of long-term control. - Allergy shots "re-train" and "reset" the immune system to ignore environmental allergens and decrease the resulting immune response to those allergens (sneezing, itchy watery eyes, runny nose, nasal congestion, etc).    - Allergy shots improve symptoms in 80-85% of patients.   - Have access to albuterol inhaler 2 puffs every 4-6 hours as needed for cough/wheeze/shortness of breath/chest tightness.  May use 15-20 minutes prior to activity.   Monitor frequency of use.     - Food allergy testing was positive to barley and bakers yeast.   Continue to monitor for symptoms after eating barely or yeast containing foods.  If you are having symptoms with ingestion of these foods then remove from diet.  If you are not having symptoms after eating these foods then you are sensitized only and can keep in diet.   - Due to positive food testing have access to self-injectable Epipen 0.3mg  - Follow emergency action plan in case of allergic reaction    Allergy: food  allergy is when you have eaten a food, developed an allergic reaction after eating the food and have IgE to the food (positive food testing either by skin testing or blood testing).  Food allergy could lead to life threatening symptoms  Sensitivity: occurs when you have IgE to a food (positive food testing either by skin testing or blood testing) but is a food you eat without any issues.  This is not an allergy and we recommend keeping the food in the diet  Intolerance: this is when you have negative testing by either skin testing or blood testing thus not allergic but the food causes symptoms (like belly pain, bloating, diarrhea etc) with ingestion.  These foods should be avoided to prevent symptoms.    Follow-up in 6 months or sooner if needed

## 2022-10-29 DIAGNOSIS — H35363 Drusen (degenerative) of macula, bilateral: Secondary | ICD-10-CM | POA: Diagnosis not present

## 2022-10-29 DIAGNOSIS — H5203 Hypermetropia, bilateral: Secondary | ICD-10-CM | POA: Diagnosis not present

## 2022-10-29 DIAGNOSIS — H2513 Age-related nuclear cataract, bilateral: Secondary | ICD-10-CM | POA: Diagnosis not present

## 2022-10-29 DIAGNOSIS — H35371 Puckering of macula, right eye: Secondary | ICD-10-CM | POA: Diagnosis not present

## 2022-11-05 DIAGNOSIS — Z1231 Encounter for screening mammogram for malignant neoplasm of breast: Secondary | ICD-10-CM | POA: Diagnosis not present

## 2022-11-05 LAB — HM MAMMOGRAPHY

## 2023-01-10 ENCOUNTER — Other Ambulatory Visit: Payer: Self-pay

## 2023-01-10 MED ORDER — METOPROLOL SUCCINATE ER 25 MG PO TB24: 25.0000 mg | ORAL_TABLET | Freq: Every day | ORAL | 0 refills | Status: DC

## 2023-02-10 ENCOUNTER — Other Ambulatory Visit: Payer: Self-pay | Admitting: Cardiology

## 2023-02-14 ENCOUNTER — Telehealth: Payer: Self-pay | Admitting: Allergy

## 2023-02-14 NOTE — Telephone Encounter (Signed)
Patient called and stated that she has head congestion, cough, headache, chest congestion. States she gets this a couple of times a year. Patient is taking zyrtek,ipratropium and inhaler with limited relief. She stated she is not getting better. She requested flonase from the pharmacy but hasn't got it yet. Patient is requesting a call back on what else she can take or do to relieve these symptoms. Patients pharmacy is [facility] on [address]. Patients call back number is [phone].

## 2023-02-15 ENCOUNTER — Other Ambulatory Visit: Payer: Self-pay | Admitting: *Deleted

## 2023-02-15 NOTE — Telephone Encounter (Signed)
Lm for pt to call us back about this °

## 2023-02-15 NOTE — Telephone Encounter (Signed)
Symptoms started last week and mid by Friday evening seemed like it really took ahold and had no voice Saturday morning. She has not done a covid, not been seen at ur or pcp Do you still want to send in the augmentin?

## 2023-02-15 NOTE — Telephone Encounter (Signed)
Pt is scheduled to be seen tomorrow at 10am with dr Delorse Lek

## 2023-02-16 ENCOUNTER — Encounter: Payer: Self-pay | Admitting: Allergy

## 2023-02-16 ENCOUNTER — Ambulatory Visit: Payer: PPO | Admitting: Allergy

## 2023-02-16 VITALS — HR 70 | Resp 14

## 2023-02-16 DIAGNOSIS — J3089 Other allergic rhinitis: Secondary | ICD-10-CM

## 2023-02-16 DIAGNOSIS — H1013 Acute atopic conjunctivitis, bilateral: Secondary | ICD-10-CM | POA: Diagnosis not present

## 2023-02-16 DIAGNOSIS — J018 Other acute sinusitis: Secondary | ICD-10-CM | POA: Diagnosis not present

## 2023-02-16 DIAGNOSIS — T781XXD Other adverse food reactions, not elsewhere classified, subsequent encounter: Secondary | ICD-10-CM

## 2023-02-16 MED ORDER — AMOXICILLIN-POT CLAVULANATE 875-125 MG PO TABS: 1.0000 | ORAL_TABLET | Freq: Two times a day (BID) | ORAL | 0 refills | Status: AC

## 2023-02-16 NOTE — Progress Notes (Signed)
Follow-up Note  RE: Janice Vincent MRN: [id number] DOB: May 04, 1955 Date of Office Visit: 02/16/2023   History of present illness: Janice Vincent is a 67 y.o. female presenting today for sick visit.  She was last seen in the office on 09/16/22 by myself for acute sinusitis, allergic rhinitis with conjunctivitis and food allergy.   Discussed the use of AI scribe software for clinical note transcription with the patient, who gave verbal consent to proceed.  She presents with symptoms that began approximately a week ago. Initially, she experienced chest tightness and congestion, which she attributed to allergies. She began using an inhaler and nasal spray to manage these symptoms. However, after attending an outdoor event where she was exposed to pollens and states it was windy and they were cooking hotdogs so there was smoke exposure, her symptoms worsened. The following morning, she woke up unable to speak and experienced increased head congestion. Over the weekend, her head congestion persisted and by Monday, she felt her condition was deteriorating.  Denies fever.  She has been using Flonase (daily) and ipratropium (twice a day) nasal sprays, and started taking Zyrtec last week. She also reported using an inhaler two to three times a day, but noted that she doesn't experience the same immediate relief as she used to. She described a sensation of weight on her chest, but denied any wheezing or distress. She has not been using any over-the-counter cough medications or Mucinex. She has received her flu and COVID-19 vaccines at the beginning of October.      Review of systems: 10pt ROS negative unless noted above in HPI   All other systems negative unless noted above in HPI  Past medical/social/surgical/family history have been reviewed and are unchanged unless specifically indicated below.  No changes  Medication List: Current Outpatient Medications  Medication Sig Dispense Refill    albuterol (VENTOLIN HFA) 108 (90 Base) MCG/ACT inhaler Inhale 2 puffs into the lungs every 4 (four) hours as needed. 18 g 1   amoxicillin-clavulanate (AUGMENTIN) 875-125 MG tablet Take 1 tablet by mouth 2 (two) times daily for 10 days. 20 tablet 0   cetirizine (ZYRTEC) 10 MG tablet Take 10 mg by mouth daily as needed. Seasonal allergies     EPINEPHrine (EPIPEN 2-PAK) 0.3 mg/0.3 mL IJ SOAJ injection Inject 0.3 mg into the muscle as needed for anaphylaxis. 0.3 mL 1   fluticasone (FLONASE) 50 MCG/ACT nasal spray 2 sprays each nostril daily for 1-2 weeks at a time before stopping once nasal congestion improves. 16 g 5   ipratropium (ATROVENT) 0.06 % nasal spray 2 sprays per nostril as needed for nasal drainage/ throat clearing control 15 mL 5   metoprolol succinate (TOPROL-XL) 25 MG 24 hr tablet Take 1 tablet (25 mg total) by mouth daily. NEED OV. 15 tablet 0   No current facility-administered medications for this visit.     Known medication allergies: Allergies  Allergen Reactions   Sulfonamide Derivatives     unknown     Physical examination: Pulse 70, resp. rate 14.  General: Alert, interactive, in no acute distress. HEENT: PERRLA, TMs pearly gray, turbinates markedly edematous without discharge, post-pharynx non erythematous. Neck: Supple without lymphadenopathy. Lungs: Clear to auscultation without wheezing, rhonchi or rales. {no increased work of breathing. CV: Normal S1, S2 without murmurs. Abdomen: Nondistended, nontender. Skin: Warm and dry, without lesions or rashes. Extremities:  No clubbing, cyanosis or edema. Neuro:   Grossly intact.  Diagnositics/Labs: None today  Assessment and  plan: Acute sinusitis Reactive airway with illness  - Treat current acute sinusitis with Augmentin 875mg  1 tab twice a day for 10 days.  If symptoms not improved by Friday then take Prednisone 20mg  x 5 days.  - Recommend Mucinex with plenty of water to help thin mucus. - Get adequate rest  and stay well hydrated.  - Have access to albuterol inhaler 2 puffs every 4-6 hours as needed for cough/wheeze/shortness of breath/chest tightness.  May use 15-20 minutes prior to activity.   Monitor frequency of use.    Allergic rhinitis with conjunctivitis - Continue avoidance measures for grasses, weeds, trees, indoor molds, outdoor molds, dust mites, and cat. - Continue with: Zyrtec (cetirizine) 10mg  tablet once daily as needed. Flonase 2 sprays each nostril daily for 1-2 weeks at a time before stopping once nasal congestion improves for maximum benefit.  Atrovent 2 sprays per nostril  as needed for nasal drainage/throat clearing control.     - You can use an extra dose of the antihistamine, if needed, for breakthrough symptoms.  - Consider nasal saline rinses 1-2 times daily to remove allergens from the nasal cavities as well as help with mucous clearance (this is especially helpful to do before the nasal sprays are given) - Consider allergy shots as a means of long-term control if medication management is not effective  Food allergy - Continue to monitor for symptoms after eating barely or yeast containing foods.  If you are having symptoms with ingestion of these foods then remove from diet.  If you are not having symptoms after eating these foods then you are sensitized only and can keep in diet.   - Due to positive food testing have access to self-injectable Epipen 0.3mg  - Follow emergency action plan in case of allergic reaction    Allergy: food allergy is when you have eaten a food, developed an allergic reaction after eating the food and have IgE to the food (positive food testing either by skin testing or blood testing).  Food allergy could lead to life threatening symptoms  Sensitivity: occurs when you have IgE to a food (positive food testing either by skin testing or blood testing) but is a food you eat without any issues.  This is not an allergy and we recommend keeping the food in  the diet  Intolerance: this is when you have negative testing by either skin testing or blood testing thus not allergic but the food causes symptoms (like belly pain, bloating, diarrhea etc) with ingestion.  These foods should be avoided to prevent symptoms.    Follow-up in 6 months or sooner if needed  I appreciate the opportunity to take part in Mikeya's care. Please do not hesitate to contact me with questions.  Sincerely,   Margo Aye, MD Allergy/Immunology [facility]

## 2023-02-16 NOTE — Patient Instructions (Addendum)
-   Treat current acute sinusitis with Augmentin 875mg  1 tab twice a day for 10 days.  If symptoms not improved by Friday then take Prednisone 20mg  x 5 days.  - Recommend Mucinex with plenty of water to help thin mucus. - Get adequate rest and stay well hydrated.   - Continue avoidance measures for grasses, weeds, trees, indoor molds, outdoor molds, dust mites, and cat. - Continue with: Zyrtec (cetirizine) 10mg  tablet once daily as needed. Flonase 2 sprays each nostril daily for 1-2 weeks at a time before stopping once nasal congestion improves for maximum benefit.  Atrovent 2 sprays per nostril  as needed for nasal drainage/throat clearing control.     - You can use an extra dose of the antihistamine, if needed, for breakthrough symptoms.  - Consider nasal saline rinses 1-2 times daily to remove allergens from the nasal cavities as well as help with mucous clearance (this is especially helpful to do before the nasal sprays are given) - Consider allergy shots as a means of long-term control if medication management is not effective  - Have access to albuterol inhaler 2 puffs every 4-6 hours as needed for cough/wheeze/shortness of breath/chest tightness.  May use 15-20 minutes prior to activity.   Monitor frequency of use.    - Continue to monitor for symptoms after eating barely or yeast containing foods.  If you are having symptoms with ingestion of these foods then remove from diet.  If you are not having symptoms after eating these foods then you are sensitized only and can keep in diet.   - Due to positive food testing have access to self-injectable Epipen 0.3mg  - Follow emergency action plan in case of allergic reaction    Allergy: food allergy is when you have eaten a food, developed an allergic reaction after eating the food and have IgE to the food (positive food testing either by skin testing or blood testing).  Food allergy could lead to life threatening symptoms  Sensitivity: occurs when  you have IgE to a food (positive food testing either by skin testing or blood testing) but is a food you eat without any issues.  This is not an allergy and we recommend keeping the food in the diet  Intolerance: this is when you have negative testing by either skin testing or blood testing thus not allergic but the food causes symptoms (like belly pain, bloating, diarrhea etc) with ingestion.  These foods should be avoided to prevent symptoms.    Follow-up in 6 months or sooner if needed

## 2023-03-21 ENCOUNTER — Telehealth: Payer: Self-pay | Admitting: Cardiology

## 2023-03-21 ENCOUNTER — Other Ambulatory Visit: Payer: Self-pay

## 2023-03-21 MED ORDER — METOPROLOL SUCCINATE ER 25 MG PO TB24: 25.0000 mg | ORAL_TABLET | Freq: Every day | ORAL | 0 refills | Status: DC

## 2023-03-21 NOTE — Telephone Encounter (Signed)
Refill already responded to

## 2023-03-21 NOTE — Telephone Encounter (Signed)
*  STAT* If patient is at the pharmacy, call can be transferred to refill team.   1. Which medications need to be refilled? (please list name of each medication and dose if known)   metoprolol succinate (TOPROL-XL) 25 MG 24 hr tablet     4. Which pharmacy/location (including street and city if local pharmacy) is medication to be sent to? [id number] - [place], [place] - [address] AT [address]     5. Do they need a 30 day or 90 day supply? 90

## 2023-04-30 NOTE — Progress Notes (Signed)
Cardiology Office Note   Date:  05/09/2023   ID:  Jazminne, Mechling 02/16/1956, MRN [id number]  PCP:  Loyola Mast, MD  Cardiologist:   Keymari Sato [place], MD   Chief Complaint  Patient presents with   Palpitations       History of Present Illness: Janice Vincent is a 68 y.o. female who is seen for follow up of palpitations. She has generally been in good health. She has noted some palpitations for several years.   Evaluation to date includes Echo which was normal. Event monitor showed multiple short runs of PAT which correlated with her symptoms. Labs were normal. She was started on Toprol XL 25 mg daily. On follow up she reports she still has occasional palpitations but they are short lived and typically occur at night. No real change.    Past Medical History:  Diagnosis Date   Allergy    Anxiety    Cataract    Small, no treatment at this time   Childhood asthma    DJD (degenerative joint disease)    right thumb   Elevated TSH    Osteoporosis    Postmenopausal    Right upper quadrant pain    due to IBS   Seasonal allergies    Thyroid disease    Nodules    Past Surgical History:  Procedure Laterality Date   breast biospy  1981   benign   growth removed from left foot 5th digit  2000   SKIN LESION EXCISION     multiple moles     Current Outpatient Medications  Medication Sig Dispense Refill   albuterol (VENTOLIN HFA) 108 (90 Base) MCG/ACT inhaler Inhale 2 puffs into the lungs every 4 (four) hours as needed. 18 g 1   cetirizine (ZYRTEC) 10 MG tablet Take 10 mg by mouth daily as needed. Seasonal allergies     EPINEPHrine (EPIPEN 2-PAK) 0.3 mg/0.3 mL IJ SOAJ injection Inject 0.3 mg into the muscle as needed for anaphylaxis. 0.3 mL 1   fluticasone (FLONASE) 50 MCG/ACT nasal spray 2 sprays each nostril daily for 1-2 weeks at a time before stopping once nasal congestion improves. 16 g 5   ipratropium (ATROVENT) 0.06 % nasal spray 2 sprays per nostril as needed  for nasal drainage/ throat clearing control 15 mL 5   metoprolol succinate (TOPROL-XL) 25 MG 24 hr tablet Take 1 tablet (25 mg total) by mouth daily. 90 tablet 3   No current facility-administered medications for this visit.    Allergies:   Sulfonamide derivatives    Social History:  The patient  reports that she has never smoked. She has never used smokeless tobacco. She reports current alcohol use of about 1.0 - 2.0 standard drink of alcohol per week. She reports that she does not use drugs.   Family History:  she is adopted but found out her biological family history. The patient's family history includes Alcohol abuse in her maternal grandmother and mother; Alcoholism in her mother; Atrial fibrillation in her half-brother; Celiac disease in her father; Dementia in her father; Heart failure in her father; Macular degeneration in her father; Vision loss in her father. She was adopted.    ROS:  Please see the history of present illness.   Otherwise, review of systems are positive for none.   All other systems are reviewed and negative.    PHYSICAL EXAM: VS:  BP 100/64   Pulse (!) 56   Ht 5\' 4"  (1.626  m)   Wt 147 lb (66.7 kg)   SpO2 97%   BMI 25.23 kg/m  , BMI Body mass index is 25.23 kg/m. GEN: Well nourished, well developed, in no acute distress HEENT: normal Neck: no JVD, carotid bruits, or masses Cardiac: RRR; no murmurs, rubs, or gallops,no edema  Respiratory:  clear to auscultation bilaterally, normal work of breathing GI: soft, nontender, nondistended, + BS MS: no deformity or atrophy Skin: warm and dry, no rash Neuro:  Strength and sensation are intact Psych: euthymic mood, full affect   EKG Interpretation Date/Time:  Monday May 09 2023 16:28:17 EST Ventricular Rate:  56 PR Interval:  166 QRS Duration:  84 QT Interval:  428 QTC Calculation: 413 R Axis:   2  Text Interpretation: Sinus bradycardia Normal ECG Confirmed by [place], Jene Huq [phone]) on 05/09/2023  4:48:33 PM     Recent Labs: 05/06/2023: TSH 3.47    Lipid Panel    Component Value Date/Time   CHOL 203 (H) 05/06/2023 0937   TRIG 84.0 05/06/2023 0937   HDL 56.00 05/06/2023 0937   CHOLHDL 4 05/06/2023 0937   VLDL 16.8 05/06/2023 0937   LDLCALC 130 (H) 05/06/2023 0937   LDLDIRECT 108.9 09/09/2006 1059      Wt Readings from Last 3 Encounters:  05/09/23 147 lb (66.7 kg)  05/06/23 145 lb 6.4 oz (66 kg)  09/16/22 145 lb 6.4 oz (66 kg)      Other studies Reviewed: Additional studies/ records that were reviewed today include:   Echo 02/17/21: IMPRESSIONS     1. Left ventricular ejection fraction, by estimation, is 55 to 60%. Left  ventricular ejection fraction by 3D volume is 57 %. The left ventricle has  normal function. The left ventricle has no regional wall motion  abnormalities. Left ventricular diastolic   parameters were normal. The average left ventricular global longitudinal  strain is -20.0 %. The global longitudinal strain is normal.   2. Right ventricular systolic function is normal. The right ventricular  size is normal. Tricuspid regurgitation signal is inadequate for assessing  PA pressure.   3. The mitral valve is normal in structure. Trivial mitral valve  regurgitation. No evidence of mitral stenosis.   4. The aortic valve is tricuspid. Aortic valve regurgitation is not  visualized.   5. The inferior vena cava is normal in size with greater than 50%  respiratory variability, suggesting right atrial pressure of 3 mmHg.   Comparison(s): No prior Echocardiogram.   Event monitor 03/10/21: Study Highlights    Normal sinus rhythm Rare PACs and PVCs Multiple brief runs of PAT the longest lasting 26 seconds at a rate 135 bpm Symptoms appear to correlate with PAT     Patch Wear Time:  13 days and 0 hours (2022-11-01T20:33:25-0400 to 2022-11-14T20:08:20-0500)   Patient had a min HR of 43 bpm, max HR of 203 bpm, and avg HR of 70 bpm. Predominant  underlying rhythm was Sinus Rhythm. 49 Supraventricular Tachycardia runs occurred, the run with the fastest interval lasting 7 beats with a max rate of 203 bpm, the  longest lasting 25.8 secs with an avg rate of 135 bpm. Supraventricular Tachycardia was detected within +/- 45 seconds of symptomatic patient event(s). Isolated SVEs were rare (<1.0%), SVE Couplets were rare (<1.0%), and SVE Triplets were rare (<1.0%).  Isolated VEs were rare (<1.0%), and no VE Couplets or VE Triplets were present.     ASSESSMENT AND PLAN:  1.  Palpitations - event monitor showed multiple runs of  PAT longest lasting 26 seconds. Echo and labs were normal. Recommend avoidance of cardiac stimulants like caffeine or decongestants. Continue Toprol at current dose. Will follow up in one year   Current medicines are reviewed at length with the patient today.  The patient does not have concerns regarding medicines.  The following changes have been made:  no change  Labs/ tests ordered today include:   Orders Placed This Encounter  Procedures   EKG 12-Lead     Disposition:   FU 1 year   Signed, Azjah Pardo [place], MD  05/09/2023 4:54 PM    [facility] [address], [place], [place], [address] Phone [phone], Fax [phone]

## 2023-05-06 ENCOUNTER — Ambulatory Visit (INDEPENDENT_AMBULATORY_CARE_PROVIDER_SITE_OTHER): Payer: PPO | Admitting: Family Medicine

## 2023-05-06 ENCOUNTER — Encounter: Payer: Self-pay | Admitting: Family Medicine

## 2023-05-06 ENCOUNTER — Telehealth: Payer: Self-pay

## 2023-05-06 VITALS — BP 112/62 | HR 54 | Temp 97.0°F | Ht 63.0 in | Wt 145.4 lb

## 2023-05-06 DIAGNOSIS — I4719 Other supraventricular tachycardia: Secondary | ICD-10-CM | POA: Diagnosis not present

## 2023-05-06 DIAGNOSIS — E785 Hyperlipidemia, unspecified: Secondary | ICD-10-CM

## 2023-05-06 DIAGNOSIS — J3089 Other allergic rhinitis: Secondary | ICD-10-CM

## 2023-05-06 DIAGNOSIS — E042 Nontoxic multinodular goiter: Secondary | ICD-10-CM | POA: Diagnosis not present

## 2023-05-06 DIAGNOSIS — Z0000 Encounter for general adult medical examination without abnormal findings: Secondary | ICD-10-CM | POA: Diagnosis not present

## 2023-05-06 DIAGNOSIS — Z1211 Encounter for screening for malignant neoplasm of colon: Secondary | ICD-10-CM | POA: Diagnosis not present

## 2023-05-06 LAB — LIPID PANEL
Cholesterol: 203 mg/dL — ABNORMAL HIGH (ref 0–200)
HDL: 56 mg/dL (ref >39.00)
LDL Cholesterol: 130 mg/dL — ABNORMAL HIGH (ref 0–99)
NonHDL: 146.55
Total CHOL/HDL Ratio: 4
Triglycerides: 84 mg/dL (ref 0.0–149.0)
VLDL: 16.8 mg/dL (ref 0.0–40.0)

## 2023-05-06 LAB — T4, FREE: Free T4: 0.78 ng/dL (ref 0.60–1.60)

## 2023-05-06 LAB — TSH: TSH: 3.47 u[IU]/mL (ref 0.35–5.50)

## 2023-05-06 NOTE — Assessment & Plan Note (Signed)
 I will reassess lipids today.

## 2023-05-06 NOTE — Assessment & Plan Note (Signed)
Working with Allergy. Continue cetirizine and daily nasal sprays.

## 2023-05-06 NOTE — Patient Instructions (Signed)
Visit Information  Thank you for taking time to visit with me today. Please don't hesitate to contact me if I can be of assistance to you.   Following are the goals we discussed today:   Goals Addressed             This Visit's Progress    COMPLETED: Care Coordination Activities-no follow up required       Care Coordination Interventions: Discussed services and support. Advised to discuss with primary care physician if services needed in the future.         If you are experiencing a Mental Health or Behavioral Health Crisis or need someone to talk to, please call the Suicide and Crisis Lifeline: 988   Patient verbalizes understanding of instructions and care plan provided today and agrees to view in MyChart. Active MyChart status and patient understanding of how to access instructions and care plan via MyChart confirmed with patient.     The patient has been provided with contact information for the care management team and has been advised to call with any health related questions or concerns.   Bary Leriche, RN, MSN [facility], [facility] RN Care Manager Direct Dial: [phone]  Fax: [phone] Website: [email or web address]

## 2023-05-06 NOTE — Progress Notes (Signed)
[facility] Trecia Rogers [facility] [address] [place] [address] Dept: [phone] Dept Fax: [phone]  Annual Physical Visit  Subjective:    Patient ID: Janice Vincent, female    DOB: 08/03/1955, 68 y.o..   MRN: [id number]  Chief Complaint  Patient presents with   Annual Exam    CPE/labs.   No concerns.  Fasting today.     History of Present Illness:  Patient is in today for an annual physical/preventative visit.  Review of Systems  Constitutional:  Negative for chills, diaphoresis, fever, malaise/fatigue and weight loss.  HENT:  Positive for congestion. Negative for ear pain, hearing loss, sinus pain, sore throat and tinnitus.        History of allergic rhinitis. Currently on cetirizine, Flonase, and Atrovent nasal sprays. Working with allergist to consider restarting immunotherapy.  Eyes:  Negative for blurred vision, pain, discharge and redness.  Respiratory:  Negative for cough, shortness of breath and wheezing.   Cardiovascular:  Positive for palpitations. Negative for chest pain.       History of PAT. Occasional episodes at night that are still overall managed with metoprolol.  Gastrointestinal:  Negative for abdominal pain, constipation, diarrhea, heartburn, nausea and vomiting.       Notes intermittent episodes of fullness in the epigastrium with pressure and belching.  Musculoskeletal:  Negative for back pain, joint pain and myalgias.  Skin:  Negative for itching and rash.  Psychiatric/Behavioral:  Negative for depression. The patient is not nervous/anxious.    Past Medical History: Patient Active Problem List   Diagnosis Date Noted   Borderline hyperlipidemia 05/03/2022   PAT (paroxysmal atrial tachycardia) (HCC) 03/24/2021   Hoarseness 02/25/2014   Allergic rhinitis 03/27/2009   History of colon polyps 01/31/2008   Diverticulosis of colon 01/08/2008   Multinodular goiter 12/29/2007   Mild intermittent asthma 02/18/2007    Irritable bowel syndrome with diarrhea 02/18/2007   Past Surgical History:  Procedure Laterality Date   breast biospy  1981   benign   growth removed from left foot 5th digit  2000   SKIN LESION EXCISION     multiple moles   Family History  Adopted: Yes  Problem Relation Age of Onset   Alcoholism Mother    Alcohol abuse Mother    Heart failure Father        now age 7   Macular degeneration Father    Celiac disease Father    Dementia Father    Vision loss Father    Atrial fibrillation Half-Brother    Alcohol abuse Maternal Grandmother    Outpatient Medications Prior to Visit  Medication Sig Dispense Refill   albuterol (VENTOLIN HFA) 108 (90 Base) MCG/ACT inhaler Inhale 2 puffs into the lungs every 4 (four) hours as needed. 18 g 1   cetirizine (ZYRTEC) 10 MG tablet Take 10 mg by mouth daily as needed. Seasonal allergies     EPINEPHrine (EPIPEN 2-PAK) 0.3 mg/0.3 mL IJ SOAJ injection Inject 0.3 mg into the muscle as needed for anaphylaxis. 0.3 mL 1   fluticasone (FLONASE) 50 MCG/ACT nasal spray 2 sprays each nostril daily for 1-2 weeks at a time before stopping once nasal congestion improves. 16 g 5   ipratropium (ATROVENT) 0.06 % nasal spray 2 sprays per nostril as needed for nasal drainage/ throat clearing control 15 mL 5   metoprolol succinate (TOPROL-XL) 25 MG 24 hr tablet Take 1 tablet (25 mg total) by mouth daily. 90 tablet 0   No  facility-administered medications prior to visit.   Allergies  Allergen Reactions   Sulfonamide Derivatives     unknown   Objective:   Today's Vitals   05/06/23 0854  BP: 112/62  Pulse: (!) 54  Temp: (!) 97 F (36.1 C)  TempSrc: Temporal  SpO2: 96%  Weight: 145 lb 6.4 oz (66 kg)  Height: 5\' 3"  (1.6 m)   Body mass index is 25.76 kg/m.   General: Well developed, well nourished. No acute distress. HEENT: Normocephalic, non-traumatic. PERRL, EOMI. Conjunctiva clear. External ears normal. EAC and TMs normal   bilaterally. Nose  clear without congestion or rhinorrhea. Mucous membranes moist. Oropharynx clear. Good dentition. Neck: Supple. No lymphadenopathy. No thyromegaly. Lungs: Clear to auscultation bilaterally. No wheezing, rales or rhonchi. CV: RRR without murmurs or rubs. Pulses 2+ bilaterally. Abdomen: Soft, non-tender. Bowel sounds positive, normal pitch and frequency. No hepatosplenomegaly. No rebound or   guarding. Back: Straight. No CVA tenderness bilaterally. Extremities: Full ROM. No joint swelling or tenderness. No edema noted. Skin: Warm and dry. No rashes. Psych: Alert and oriented. Normal mood and affect.  Health Maintenance Due  Topic Date Due   MAMMOGRAM  10/31/2022   Colonoscopy  05/08/2023     Assessment & Plan:   Problem List Items Addressed This Visit       Cardiovascular and Mediastinum   PAT (paroxysmal atrial tachycardia) (HCC)   Stable. Continue metoprolol succinate 25 mg daily.        Respiratory   Allergic rhinitis   Working with Allergy. Continue cetirizine and daily nasal sprays.        Endocrine   Multinodular goiter   I will reassess thyroid tests today, as she has had elevated TSH levels in the past.      Relevant Orders   TSH   T4, free     Other   Borderline hyperlipidemia   I will reassess lipids today.      Relevant Orders   Lipid panel   Other Visit Diagnoses       Annual physical exam    -  Primary     Screening for colon cancer       Relevant Orders   Ambulatory referral to Gastroenterology       Return in about 1 year (around 05/05/2024) for Annual preventative care.   Loyola Mast, MD

## 2023-05-06 NOTE — Assessment & Plan Note (Signed)
Stable.  Continue metoprolol succinate 25 mg daily. 

## 2023-05-06 NOTE — Patient Outreach (Signed)
  Care Coordination   In Person Provider Office Visit Note   05/06/2023 Name: Janice Vincent MRN: [id number] DOB: December 26, 1955  Janice Vincent is a 68 y.o. year old female who sees Rudd, Bertram Millard, MD for primary care. I engaged with Zara Chess in the providers office today.  What matters to the patients health and wellness today?  N/A    Goals Addressed             This Visit's Progress    COMPLETED: Care Coordination Activities-no follow up required       Care Coordination Interventions: Discussed services and support. Advised to discuss with primary care physician if services needed in the future.        SDOH assessments and interventions completed:  Yes  SDOH Interventions Today    Flowsheet Row Most Recent Value  SDOH Interventions   Housing Interventions Intervention Not Indicated  Transportation Interventions Intervention Not Indicated        Care Coordination Interventions:  Yes, provided   Follow up plan: No further intervention required.   Encounter Outcome:  Patient Visit Completed   Bary Leriche, RN, MSN [facility]  [facility], [facility] RN Care Manager Direct Dial: [phone]  Fax: [phone] Website: [email or web address]

## 2023-05-06 NOTE — Assessment & Plan Note (Signed)
I will reassess thyroid tests today, as she has had elevated TSH levels in the past.

## 2023-05-09 ENCOUNTER — Encounter: Payer: Self-pay | Admitting: Cardiology

## 2023-05-09 ENCOUNTER — Encounter: Payer: Self-pay | Admitting: Family Medicine

## 2023-05-09 ENCOUNTER — Ambulatory Visit: Payer: PPO | Attending: Cardiology | Admitting: Cardiology

## 2023-05-09 VITALS — BP 100/64 | HR 56 | Ht 64.0 in | Wt 147.0 lb

## 2023-05-09 DIAGNOSIS — I4719 Other supraventricular tachycardia: Secondary | ICD-10-CM | POA: Diagnosis not present

## 2023-05-09 DIAGNOSIS — R002 Palpitations: Secondary | ICD-10-CM | POA: Diagnosis not present

## 2023-05-09 MED ORDER — METOPROLOL SUCCINATE ER 25 MG PO TB24: 25.0000 mg | ORAL_TABLET | Freq: Every day | ORAL | 3 refills | Status: AC

## 2023-05-09 NOTE — Patient Instructions (Signed)

## 2023-06-20 ENCOUNTER — Encounter: Payer: Self-pay | Admitting: Internal Medicine

## 2023-08-15 ENCOUNTER — Ambulatory Visit (INDEPENDENT_AMBULATORY_CARE_PROVIDER_SITE_OTHER): Payer: PPO

## 2023-08-15 DIAGNOSIS — Z0000 Encounter for general adult medical examination without abnormal findings: Secondary | ICD-10-CM
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
## 2023-08-15 NOTE — Patient Instructions (Signed)
 Janice Vincent , Thank you for taking time to come for your Medicare Wellness Visit. I appreciate your ongoing commitment to your health goals. Please review the following plan we discussed and let me know if I can assist you in the future.   Referrals/Orders/Follow-Ups/Clinician Recommendations: none  This is a list of the screening recommended for you and due dates:  Health Maintenance  Topic Date Due   COVID-19 Vaccine (8 - 2024-25 season) 03/11/2023   Colon Cancer Screening  05/08/2023   Mammogram  11/05/2023   Flu Shot  11/18/2023   Medicare Annual Wellness Visit  08/14/2024   DTaP/Tdap/Td vaccine (3 - Td or Tdap) 12/20/2031   Pneumonia Vaccine  Completed   DEXA scan (bone density measurement)  Completed   Hepatitis C Screening  Completed   Zoster (Shingles) Vaccine  Completed   HPV Vaccine  Aged Out   Meningitis B Vaccine  Aged Out    Advanced directives: (Copy Requested) Please bring a copy of your health care power of attorney and living will to the office to be added to your chart at your convenience. You can mail to [facility] [address] [place], [place] [address] or email to [email or web address]  Next Medicare Annual Wellness Visit scheduled for next year: Yes  insert Preventive Care attachment Insert FALL PREVENTION attachment if needed

## 2023-08-19 ENCOUNTER — Other Ambulatory Visit: Payer: Self-pay

## 2023-08-19 ENCOUNTER — Telehealth: Payer: Self-pay

## 2023-08-19 ENCOUNTER — Ambulatory Visit: Payer: PPO | Admitting: Allergy

## 2023-08-19 ENCOUNTER — Encounter: Payer: Self-pay | Admitting: Allergy

## 2023-08-19 VITALS — BP 110/68 | HR 69 | Temp 98.1°F | Resp 16 | Ht 62.75 in | Wt 140.8 lb

## 2023-08-19 DIAGNOSIS — T781XXD Other adverse food reactions, not elsewhere classified, subsequent encounter: Secondary | ICD-10-CM

## 2023-08-19 DIAGNOSIS — J4520 Mild intermittent asthma, uncomplicated: Secondary | ICD-10-CM

## 2023-08-19 DIAGNOSIS — J3089 Other allergic rhinitis: Secondary | ICD-10-CM | POA: Diagnosis not present

## 2023-08-19 DIAGNOSIS — J302 Other seasonal allergic rhinitis: Secondary | ICD-10-CM | POA: Diagnosis not present

## 2023-08-19 MED ORDER — MONTELUKAST SODIUM 10 MG PO TABS: 10.0000 mg | ORAL_TABLET | Freq: Every day | ORAL | 5 refills | Status: DC

## 2023-08-19 MED ORDER — EPINEPHRINE 0.3 MG/0.3ML IJ SOAJ: 0.3000 mg | INTRAMUSCULAR | 1 refills | Status: DC | PRN

## 2023-08-19 MED ORDER — IPRATROPIUM BROMIDE 0.06 % NA SOLN: NASAL | 5 refills | Status: DC

## 2023-08-19 MED ORDER — ALBUTEROL SULFATE HFA 108 (90 BASE) MCG/ACT IN AERS: 2.0000 | INHALATION_SPRAY | RESPIRATORY_TRACT | 1 refills | Status: DC | PRN

## 2023-08-19 MED ORDER — FLUTICASONE PROPIONATE 50 MCG/ACT NA SUSP: NASAL | 5 refills | Status: DC

## 2023-08-19 NOTE — Telephone Encounter (Signed)
 Patient is interested in [facility]. Patient had an office visit today and signed consent. Patient was informed there is a waiting list and we will give her a call once we have available dates. Patient verbalized understanding.

## 2023-08-19 NOTE — Progress Notes (Signed)
 Follow-up Note  RE: Janice Vincent MRN: [id number] DOB: 11-Apr-1956 Date of Office Visit: 08/19/2023   History of present illness: Janice Vincent is a 68 y.o. female presenting today for follow-up of allergic rhinitis and food allergy.  She was last seen in the office on 02/16/23 by myself.  Last visit she was treated for acute sinusitis with augmentin  and prednisone .  Discussed the use of AI scribe software for clinical note transcription with the patient, who gave verbal consent to proceed.  Since March 1st, she has experienced a significant worsening of her allergy symptoms due to the pollen season. Initially, she managed her symptoms with Zyrtec, Flonase  and Atrovent  nasal sprays. However, her symptoms escalated in April after traveling to [place]  and [place], and attending an herb festival in [place]. By mid-April, she experienced severe mucus production and completely lost her voice, which was unusual for her.  She describes her voice as 'a whole different level' of raspy, and prolonged talking induces coughing. She experiences significant drainage and has been using Mucinex and Zyrtec.  She does feel like the Zyrtec is providing some relief. She attempted using a neti pot but did not notice a significant difference and has not used consistently. She has a history of asthma and has been using albuterol  more often over the past week.  She is interested at this time in doing another course of immunotherapy.  She has had 2 previous courses as a teen and as a younger adult and notes improvement with both courses.   Review of systems: 10pt ROS negative unless noted above in HPI   Past medical/social/surgical/family history have been reviewed and are unchanged unless specifically indicated below.  No changes  Medication List: Current Outpatient Medications  Medication Sig Dispense Refill   albuterol  (VENTOLIN  HFA) 108 (90 Base) MCG/ACT inhaler Inhale 2 puffs into the lungs every 4  (four) hours as needed. 18 g 1   cetirizine (ZYRTEC) 10 MG tablet Take 10 mg by mouth daily as needed. Seasonal allergies     EPINEPHrine  (EPIPEN  2-PAK) 0.3 mg/0.3 mL IJ SOAJ injection Inject 0.3 mg into the muscle as needed for anaphylaxis. 0.3 mL 1   fluticasone  (FLONASE ) 50 MCG/ACT nasal spray 2 sprays each nostril daily for 1-2 weeks at a time before stopping once nasal congestion improves. 16 g 5   ipratropium (ATROVENT ) 0.06 % nasal spray 2 sprays per nostril as needed for nasal drainage/ throat clearing control 15 mL 5   metoprolol  succinate (TOPROL -XL) 25 MG 24 hr tablet Take 1 tablet (25 mg total) by mouth daily. 90 tablet 3   No current facility-administered medications for this visit.     Known medication allergies: Allergies  Allergen Reactions   Sulfonamide Derivatives     unknown     Physical examination: Blood pressure 110/68, pulse 69, temperature 98.1 F (36.7 C), temperature source Temporal, resp. rate 16, height 5' 2.75" (1.594 m), weight 140 lb 12.8 oz (63.9 kg), SpO2 96%.  General: Alert, interactive, in no acute distress. HEENT: PERRLA, TMs pearly gray, turbinates moderately edematous without discharge, nasal mucosa erythematous, post-pharynx non erythematous. Neck: Supple without lymphadenopathy. Lungs: Clear to auscultation without wheezing, rhonchi or rales. {no increased work of breathing. CV: Normal S1, S2 without murmurs. Abdomen: Nondistended, nontender. Skin: Warm and dry, without lesions or rashes. Extremities:  No clubbing, cyanosis or edema. Neuro:   Grossly intact.  Diagnositics/Labs: None today  Assessment and plan: Allergic rhinitis Reactive airway Food allergy  - Continue  avoidance measures for grasses, weeds, trees, indoor molds, outdoor molds, dust mites, and cat. - Continue with:  Zyrtec (cetirizine) 10mg  tablet once daily at this time.  May take additional dose if needed.  Flonase  2 sprays each nostril daily for 1-2 weeks at a time  before stopping once nasal congestion improves for maximum benefit.  Atrovent  2 sprays per nostril as needed up to 4 times a day for nasal drainage/throat clearing control.     - Perform nasal saline rinses 1-2 times daily to remove allergens from the nasal cavities as well as help with mucous clearance (this is especially helpful to do before the nasal sprays are given).  Use your nasal sprays after performing rinse.  - Start:  Singulair 10mg  daily at bedtime. This is an antileukotriene that can help with both allergy and asthma symptom control.  If you notice any change in mood/behavior/sleep after starting Singulair then stop this medication and let us  know.  Symptoms resolve after stopping the medication.   - Recommend allergy shots as a means of long-term control.  Discussed traditional and RUSH build-up options today for starting including pre-medication regimen for RUSH start.  Consent obtained today and will get you scheduled to start.    - Have access to albuterol  inhaler 2 puffs every 4-6 hours as needed for cough/wheeze/shortness of breath/chest tightness.  May use 15-20 minutes prior to activity.   Monitor frequency of use.    - Continue to monitor for symptoms after eating barley or yeast containing foods.  If you are having symptoms with ingestion of these foods then remove from diet.  If you are not having symptoms after eating these foods then you are sensitized only and can keep in diet.   - Due to positive food testing have access to self-injectable Epipen  0.3mg  - Follow emergency action plan in case of allergic reaction    Allergy: food allergy is when you have eaten a food, developed an allergic reaction after eating the food and have IgE to the food (positive food testing either by skin testing or blood testing).  Food allergy could lead to life threatening symptoms  Sensitivity: occurs when you have IgE to a food (positive food testing either by skin testing or blood testing) but  is a food you eat without any issues.  This is not an allergy and we recommend keeping the food in the diet  Intolerance: this is when you have negative testing by either skin testing or blood testing thus not allergic but the food causes symptoms (like belly pain, bloating, diarrhea etc) with ingestion.  These foods should be avoided to prevent symptoms.    Follow-up in 6 months or sooner if needed  I appreciate the opportunity to take part in Janice Vincent's care. Please do not hesitate to contact me with questions.  Sincerely,   Catha Clink, MD Allergy/Immunology [facility]

## 2023-08-19 NOTE — Patient Instructions (Addendum)
 - Continue avoidance measures for grasses, weeds, trees, indoor molds, outdoor molds, dust mites, and cat. - Continue with:  Zyrtec (cetirizine) 10mg  tablet once daily at this time.  May take additional dose if needed.  Flonase  2 sprays each nostril daily for 1-2 weeks at a time before stopping once nasal congestion improves for maximum benefit.  Atrovent  2 sprays per nostril as needed up to 4 times a day for nasal drainage/throat clearing control.     - Perform nasal saline rinses 1-2 times daily to remove allergens from the nasal cavities as well as help with mucous clearance (this is especially helpful to do before the nasal sprays are given).  Use your nasal sprays after performing rinse.  - Start:  Singulair 10mg  daily at bedtime. This is an antileukotriene that can help with both allergy and asthma symptom control.  If you notice any change in mood/behavior/sleep after starting Singulair then stop this medication and let us  know.  Symptoms resolve after stopping the medication.   - Recommend allergy shots as a means of long-term control.  Discussed traditional and RUSH build-up options today for starting including pre-medication regimen for RUSH start.  Consent obtained today and will get you scheduled to start.    - Have access to albuterol  inhaler 2 puffs every 4-6 hours as needed for cough/wheeze/shortness of breath/chest tightness.  May use 15-20 minutes prior to activity.   Monitor frequency of use.    - Continue to monitor for symptoms after eating barley or yeast containing foods.  If you are having symptoms with ingestion of these foods then remove from diet.  If you are not having symptoms after eating these foods then you are sensitized only and can keep in diet.   - Due to positive food testing have access to self-injectable Epipen  0.3mg  - Follow emergency action plan in case of allergic reaction    Allergy: food allergy is when you have eaten a food, developed an allergic reaction  after eating the food and have IgE to the food (positive food testing either by skin testing or blood testing).  Food allergy could lead to life threatening symptoms  Sensitivity: occurs when you have IgE to a food (positive food testing either by skin testing or blood testing) but is a food you eat without any issues.  This is not an allergy and we recommend keeping the food in the diet  Intolerance: this is when you have negative testing by either skin testing or blood testing thus not allergic but the food causes symptoms (like belly pain, bloating, diarrhea etc) with ingestion.  These foods should be avoided to prevent symptoms.    Follow-up in 6 months or sooner if needed    Saline rinse  Saline sinus rinses can bring relief to patients with chronic sinus or rhinitis problems without the use of medication.  If you suffer from chronic or acute sinus infections, sinus rinses can be helpful in removing and thinning out excessive mucus. If you have allergic rhinitis, these rinses can bring relief by removing allergens from the nostrils and sinuses.  Although easy to use, the rinsing process may seem unusual at first and may take a little getting used to.  Several commercial sinus rinse devices are available without a prescription. They are convenient to use and can be found in most pharmacies. But you can also make your own rinse at home with only three ingredients and at a fraction of the cost.  Saline Rinse Recipe Ingredients  1.  Pickling or canning salt-containing no iodide, anti-caking agents or preservatives (these can be irritating to the nasal lining) 2.  Baking soda 3.  8 ounces (1 cup) of lukewarm distilled or boiled water  In a clean container, mix 3 teaspoons of iodide-free salt with 1 teaspoon of baking soda and store in a small airtight container. Add 1 teaspoon of the mixture to 8 ounces (1 cup) of lukewarm distilled or boiled water.  Use less dry ingredients to make a weaker  solution if burning or stinging is experienced. For children, use a half-teaspoon with 4 ounces of water.  Using a soft rubber ear bulb syringe, infant nasal bulb or a commercial nasal saline rinse product from your drug store, use the rinse by following these steps:  1.  Draw up saline into the bulb. Tilt your head downward over a sink (or in the shower) and rotate to the left. Squeeze approximately 4 ounces of solution gently into the right (top) nostril. Breathe normally through your mouth. In a few seconds the solution should come out through your left nostril. Rotate your head and repeat the process on the left side.  2.  Adjust your head position as needed so the solution does not go down the back of your throat or into your ears.  3.  Blow your nose very gently to prevent the solution from going into your ear and causing discomfort.  4.  After using the rinse, you may continue using your prescribed nasal medications as normal. You may notice that they work better.  You can store your solution for up to 3 days in the refrigerator, but it's best to mix a fresh solution with every application.  Do not use sinus rinses if your nasal passageway is severely blocked. As with any medical product, be sure to speak to your doctor about using sinus rinses and stop using if you experience pain, nosebleeds or other problems.  An allergist / immunologist has specialized training and experience to accurately diagnose your condition and provide a treatment plan to help you feel better.

## 2023-08-26 ENCOUNTER — Telehealth: Payer: Self-pay | Admitting: *Deleted

## 2023-08-26 ENCOUNTER — Encounter: Payer: Self-pay | Admitting: Internal Medicine

## 2023-08-26 ENCOUNTER — Ambulatory Visit (AMBULATORY_SURGERY_CENTER): Payer: Self-pay | Admitting: *Deleted

## 2023-08-26 VITALS — Ht 63.0 in | Wt 140.0 lb

## 2023-08-26 DIAGNOSIS — Z860100 Personal history of colon polyps, unspecified: Secondary | ICD-10-CM

## 2023-08-26 MED ORDER — NA SULFATE-K SULFATE-MG SULF 17.5-3.13-1.6 GM/177ML PO SOLN: 1.0000 | Freq: Once | ORAL | 0 refills | Status: AC

## 2023-08-26 NOTE — Progress Notes (Signed)
 Pt's name and DOB verified at the beginning of the pre-visit wit 2 identifiers   Pt denies any difficulty with ambulating,sitting, laying down or rolling side to side  Pt has no issues moving head neck or swallowing  No egg or soy allergy known to patient   No issues known to pt with past sedation with any surgeries or procedures  No FH of Malignant Hyperthermia  Pt is not on home 02   Pt is not on blood thinners   Pt denies issues with constipation   Pt is not on dialysis  Pt states she has mild Tachycardia  Pt denies any upcoming cardiac testing  Patient's chart reviewed by Rogena Class CNRA prior to pre-visit and patient appropriate for the [facility].  Pre-visit completed and red dot placed by patient's name on their procedure day (on provider's schedule).    Visit by phone  Visit in person  Pt states weight is 140 lb   IInstructions reviewed. Pt given  both [facility] main # and MD on call # prior to instructions.  Pt states understanding of instructions. Instructed pt to review instructions again prior to procedure and call main # given if has questions.. Pt states they will.   Instructed pt on where to find instructions on My Chart.

## 2023-08-26 NOTE — Telephone Encounter (Signed)
 Attempt to reach pt for pre-visit. LM with call back # Will attempt to reach again in 5 min due to no other # listed in profile    Second attempt  reached  pt

## 2023-09-09 ENCOUNTER — Ambulatory Visit (AMBULATORY_SURGERY_CENTER): Admitting: Internal Medicine

## 2023-09-09 ENCOUNTER — Encounter: Payer: Self-pay | Admitting: Internal Medicine

## 2023-09-09 VITALS — BP 103/51 | HR 52 | Temp 97.2°F | Resp 13 | Ht 64.0 in | Wt 140.0 lb

## 2023-09-09 DIAGNOSIS — F419 Anxiety disorder, unspecified: Secondary | ICD-10-CM | POA: Diagnosis not present

## 2023-09-09 DIAGNOSIS — K648 Other hemorrhoids: Secondary | ICD-10-CM | POA: Diagnosis not present

## 2023-09-09 DIAGNOSIS — K5730 Diverticulosis of large intestine without perforation or abscess without bleeding: Secondary | ICD-10-CM | POA: Diagnosis not present

## 2023-09-09 DIAGNOSIS — D120 Benign neoplasm of cecum: Secondary | ICD-10-CM | POA: Diagnosis not present

## 2023-09-09 DIAGNOSIS — Z860101 Personal history of adenomatous and serrated colon polyps: Secondary | ICD-10-CM | POA: Diagnosis not present

## 2023-09-09 DIAGNOSIS — Z1211 Encounter for screening for malignant neoplasm of colon: Secondary | ICD-10-CM | POA: Diagnosis not present

## 2023-09-09 DIAGNOSIS — Z860100 Personal history of colon polyps, unspecified: Secondary | ICD-10-CM

## 2023-09-09 DIAGNOSIS — D122 Benign neoplasm of ascending colon: Secondary | ICD-10-CM | POA: Diagnosis not present

## 2023-09-09 DIAGNOSIS — K639 Disease of intestine, unspecified: Secondary | ICD-10-CM | POA: Diagnosis not present

## 2023-09-09 DIAGNOSIS — K635 Polyp of colon: Secondary | ICD-10-CM | POA: Diagnosis not present

## 2023-09-09 MED ORDER — SODIUM CHLORIDE 0.9 % IV SOLN
500.0000 mL | Freq: Once | INTRAVENOUS | Status: DC
Start: 2023-09-09 — End: 2023-09-09

## 2023-09-09 NOTE — Progress Notes (Signed)
 Sedate, gd SR, tolerated procedure well, VSS, report to RN

## 2023-09-09 NOTE — Progress Notes (Signed)
 HISTORY OF PRESENT ILLNESS:  Janice Vincent is a 68 y.o. female who presents today for surveillance colonoscopy.  Tubular adenoma 2008.  Negative exam 2015.  REVIEW OF SYSTEMS:  All non-GI ROS negative except for  Past Medical History:  Diagnosis Date   Allergy    Anxiety    Cataract    Small, no treatment at this time   Childhood asthma    DJD (degenerative joint disease)    right thumb   Elevated TSH    History of IBS    Macular degeneration    Developing issue   Osteoporosis    Postmenopausal    Right upper quadrant pain    due to IBS   Seasonal allergies    Tachycardia    Mild per pt   Thyroid  disease    Nodules    Past Surgical History:  Procedure Laterality Date   breast biospy  04/20/1979   benign   COLONOSCOPY     growth removed from left foot 5th digit  04/19/1998   SKIN LESION EXCISION     multiple moles    Social History Janice Vincent  reports that she has never smoked. She has never used smokeless tobacco. She reports current alcohol use of about 1.0 - 2.0 standard drink of alcohol per week. She reports that she does not use drugs.  family history includes Alcohol abuse in her maternal grandmother and mother; Alcoholism in her mother; Atrial fibrillation in her half-brother; Celiac disease in her father; Dementia in her father; Heart failure in her father; Macular degeneration in her father; Vision loss in her father. She was adopted.  Allergies  Allergen Reactions   Sulfonamide Derivatives Other (See Comments)    Childhood reaction, unable to recall       PHYSICAL EXAMINATION: Vital signs: BP 111/62   Pulse 62   Temp (!) 97.2 F (36.2 C) (Temporal)   Ht 5\' 4"  (1.626 m)   Wt 140 lb (63.5 kg)   SpO2 96%   BMI 24.03 kg/m  General: Well-developed, well-nourished, no acute distress HEENT: Sclerae are anicteric, conjunctiva pink. Oral mucosa intact Lungs: Clear Heart: Regular Abdomen: soft, nontender, nondistended, no obvious ascites, no  peritoneal signs, normal bowel sounds. No organomegaly. Extremities: No edema Psychiatric: alert and oriented x3. Cooperative     ASSESSMENT:  History of nonadvanced adenoma   PLAN:   Surveillance colonoscopy

## 2023-09-09 NOTE — Progress Notes (Signed)
 Pt's states no medical or surgical changes since previsit or office visit.

## 2023-09-09 NOTE — Op Note (Signed)
 [facility] Patient Name: Janice Vincent Procedure Date: 09/09/2023 9:41 AM MRN: [id number] Endoscopist: Murel Arlington. Elvin Hammer , MD, [id number] Age: 68 Referring MD:  Date of Birth: 1956/03/05 Gender: Female Account #: [id number] Procedure:                Colonoscopy with cold snare polypectomy x 2; biopsy                            polypectomy x 1 Indications:              High risk colon cancer surveillance: Personal                            history of non-advanced adenoma. Previous                            examinations 2008, 2015 Medicines:                Monitored Anesthesia Care Procedure:                Pre-Anesthesia Assessment:                           - Prior to the procedure, a History and Physical                            was performed, and patient medications and                            allergies were reviewed. The patient's tolerance of                            previous anesthesia was also reviewed. The risks                            and benefits of the procedure and the sedation                            options and risks were discussed with the patient.                            All questions were answered, and informed consent                            was obtained. Prior Anticoagulants: The patient has                            taken no anticoagulant or antiplatelet agents.                            After reviewing the risks and benefits, the patient                            was deemed in satisfactory condition to undergo the  procedure.                           After obtaining informed consent, the colonoscope                            was passed under direct vision. Throughout the                            procedure, the patient's blood pressure, pulse, and                            oxygen saturations were monitored continuously. The                            Olympus Scope SN: [id number] was introduced through                             the anus and advanced to the the cecum, identified                            by appendiceal orifice and ileocecal valve. The                            ileocecal valve, appendiceal orifice, and rectum                            were photographed. The quality of the bowel                            preparation was excellent. The colonoscopy was                            performed without difficulty. The patient tolerated                            the procedure well. The bowel preparation used was                            SUPREP via split dose instruction. Scope In: 10:00:55 AM Scope Out: 10:17:04 AM Scope Withdrawal Time: 0 hours 10 minutes 58 seconds  Total Procedure Duration: 0 hours 16 minutes 9 seconds  Findings:                 Two sessile polyps were found in the cecum. The                            polyps were 2 to 5 mm in size. These polyps were                            removed with a cold snare. Resection and retrieval                            were complete.  A 1 mm polyp was found in the ascending colon. The                            polyp was removed with a cold snare. Resection and                            retrieval were complete.                           Multiple diverticula were found in the sigmoid                            colon.                           Internal hemorrhoids were found during                            retroflexion. The hemorrhoids were small.                           The exam was otherwise without abnormality on                            direct and retroflexion views. Complications:            No immediate complications. Estimated blood loss:                            None. Estimated Blood Loss:     Estimated blood loss: none. Impression:               - Two 2 to 5 mm polyps in the cecum, removed with a                            cold snare. Resected and retrieved.                            - One 1 mm polyp in the ascending colon, removed                            with a cold snare. Resected and retrieved.                           - Diverticulosis in the sigmoid colon.                           - The examination was otherwise normal on direct                            and retroflexion views. Recommendation:           - Repeat colonoscopy in 5 years for surveillance.                           - Patient has a contact number  available for                            emergencies. The signs and symptoms of potential                            delayed complications were discussed with the                            patient. Return to normal activities tomorrow.                            Written discharge instructions were provided to the                            patient.                           - Resume previous diet.                           - Continue present medications.                           - Await pathology results. Murel Arlington. Elvin Hammer, MD 09/09/2023 10:22:43 AM This report has been signed electronically.

## 2023-09-09 NOTE — Progress Notes (Signed)
 Called to room to assist during endoscopic procedure.  Patient ID and intended procedure confirmed with present staff. Received instructions for my participation in the procedure from the performing physician.

## 2023-09-09 NOTE — Patient Instructions (Signed)

## 2023-09-13 ENCOUNTER — Telehealth: Payer: Self-pay

## 2023-09-13 NOTE — Telephone Encounter (Signed)
  Follow up Call-     09/09/2023    8:48 AM  Call back number  Post procedure Call Back phone  # [phone]  Permission to leave phone message Yes     Patient questions:  Do you have a fever, pain , or abdominal swelling? No. Pain Score  0 *  Have you tolerated food without any problems? Yes.    Have you been able to return to your normal activities? Yes.    Do you have any questions about your discharge instructions: Diet   No. Medications  No. Follow up visit  No.  Do you have questions or concerns about your Care? No.  Actions: * If pain score is 4 or above: No action needed, pain <4.

## 2023-09-14 ENCOUNTER — Ambulatory Visit: Payer: Self-pay | Admitting: Internal Medicine

## 2023-09-14 LAB — SURGICAL PATHOLOGY

## 2023-09-20 ENCOUNTER — Other Ambulatory Visit: Payer: Self-pay | Admitting: Allergy

## 2023-11-04 DIAGNOSIS — H5203 Hypermetropia, bilateral: Secondary | ICD-10-CM | POA: Diagnosis not present

## 2023-11-04 DIAGNOSIS — H35363 Drusen (degenerative) of macula, bilateral: Secondary | ICD-10-CM | POA: Diagnosis not present

## 2023-11-04 DIAGNOSIS — H35371 Puckering of macula, right eye: Secondary | ICD-10-CM | POA: Diagnosis not present

## 2023-11-04 DIAGNOSIS — H2513 Age-related nuclear cataract, bilateral: Secondary | ICD-10-CM | POA: Diagnosis not present

## 2023-11-18 DIAGNOSIS — Z1231 Encounter for screening mammogram for malignant neoplasm of breast: Secondary | ICD-10-CM | POA: Diagnosis not present

## 2023-11-18 LAB — HM MAMMOGRAPHY

## 2023-12-09 ENCOUNTER — Other Ambulatory Visit: Payer: Self-pay | Admitting: Allergy

## 2023-12-09 DIAGNOSIS — J302 Other seasonal allergic rhinitis: Secondary | ICD-10-CM

## 2023-12-09 DIAGNOSIS — H1013 Acute atopic conjunctivitis, bilateral: Secondary | ICD-10-CM

## 2023-12-12 DIAGNOSIS — J3081 Allergic rhinitis due to animal (cat) (dog) hair and dander: Secondary | ICD-10-CM | POA: Diagnosis not present

## 2023-12-12 DIAGNOSIS — J301 Allergic rhinitis due to pollen: Secondary | ICD-10-CM | POA: Diagnosis not present

## 2023-12-12 NOTE — Progress Notes (Signed)
 Aeroallergen Immunotherapy  Ordering Provider: Dr. Danita Brain  Patient Details Name: Janice Vincent MRN: [id number] Date of Birth: 02-Sep-1955  Order 1 of 2  Vial Label: Pollen, pets  0.3 ml (Volume)  BAU Concentration -- 7 Grass Mix* 100,000 ([place]  Blue, [place], [place], Perennial Rye, RedTop, Sweet Vernal, Timothy) 0.2 ml (Volume)  1:20 Concentration -- [place] 0.5 ml (Volume)  1:20 Concentration -- Weed Mix* 0.5 ml (Volume)  1:20 Concentration -- Eastern 10 Tree Mix (also Sweet Gum) 0.2 ml (Volume)  1:20 Concentration -- Box Elder 0.2 ml (Volume)  1:10 Concentration -- Pecan Pollen 0.2 ml (Volume)  1:20 Concentration -- Walnut, Black Pollen 0.5 ml (Volume)  1:10 Concentration -- Cat Hair   2.6  ml Extract Subtotal 2.4  ml Diluent  5.0  ml Maintenance Total  Schedule:  RUSH then B Silver Vial (1:1,000,000): Schedule B (6 doses) Blue Vial (1:100,000): Schedule B (6 doses) Yellow Vial (1:10,000): Schedule B (6 doses) Green Vial (1:1,000): Schedule B (6 doses) Red Vial (1:100): Schedule A (14 doses)  Special Instructions: RUSH in [place].  After completion of the first Red Vial, please space to every two weeks. After completion of the second Red Vial, please space to every 4 weeks. Ok to up dose new vials at 0.19mL --> 0.3 mL --> 0.5 mL.  Ok to come twice weekly, if desired, as long as there is 48 hours between injections.

## 2023-12-12 NOTE — Progress Notes (Signed)
 VIALS MADE ON 12/12/23

## 2023-12-12 NOTE — Progress Notes (Signed)
 Aeroallergen Immunotherapy   Ordering Provider: Dr. Danita Brain   Patient Details  Name: Janice Vincent  MRN: [id number]  Date of Birth: December 18, 1955   Order 2 of 2   Vial Label: Mold, mites   0.2 ml (Volume)  1:20 Concentration -- Alternaria alternata  0.2 ml (Volume)  1:20 Concentration -- Cladosporium herbarum  0.2 ml (Volume)  1:10 Concentration -- Aspergillus mix  0.2 ml (Volume)  1:10 Concentration -- Penicillium mix  0.2 ml (Volume)  1:20 Concentration -- Bipolaris sorokiniana  0.2 ml (Volume)  1:20 Concentration -- Drechslera spicifera  0.2 ml (Volume)  1:10 Concentration -- Mucor plumbeus  0.2 ml (Volume)  1:10 Concentration -- Fusarium moniliforme  0.2 ml (Volume)  1:10 Concentration -- Rhizopus oryzae  0.2 ml (Volume)  1:40 Concentration -- Botrytis cinerea  0.5 ml (Volume)   AU Concentration -- Mite Mix (DF 5,000 & DP 5,000)    2.5  ml Extract Subtotal  2.5  ml Diluent   5.0  ml Maintenance Total   Schedule: RUSH then B  Silver Vial (1:1,000,000): Schedule B (6 doses)  Blue Vial (1:100,000): Schedule B (6 doses)  Yellow Vial (1:10,000): Schedule B (6 doses)  Green Vial (1:1,000): Schedule B (6 doses)  Red Vial (1:100): Schedule A (14 doses)   Special Instructions: RUSH in [place].  After completion of the first Red Vial, please space to every two weeks. After completion of the second Red Vial, please space to every 4 weeks. Ok to up dose new vials at 0.80mL --> 0.3 mL --> 0.5 mL.  Ok to come twice weekly, if desired, as long as there is 48 hours between injections.

## 2023-12-13 DIAGNOSIS — J302 Other seasonal allergic rhinitis: Secondary | ICD-10-CM | POA: Diagnosis not present

## 2023-12-13 DIAGNOSIS — J3089 Other allergic rhinitis: Secondary | ICD-10-CM | POA: Diagnosis not present

## 2023-12-14 ENCOUNTER — Other Ambulatory Visit: Payer: Self-pay

## 2023-12-14 MED ORDER — PREDNISONE 20 MG PO TABS: ORAL_TABLET | ORAL | 0 refills | Status: DC

## 2023-12-14 MED ORDER — FAMOTIDINE 20 MG PO TABS: 20.0000 mg | ORAL_TABLET | Freq: Two times a day (BID) | ORAL | 0 refills | Status: DC

## 2023-12-15 NOTE — Progress Notes (Unsigned)
 Rapid Desensitization Note  RE: Janice Vincent MRN: [id number] DOB: [birth year] Date of Office Visit: 12/16/2023  Chief Complaint:  Patient presents today for rapid desensitization.  Assessment and Plan: Janice Vincent is a 68 y.o. female with: Other allergic rhinitis Your tolerated rush build up today up to yellow 0.73mL. We had to stop early due to your chest tightness complaint which improved with the rescue inhaler. Return in 1 week for next allergy injection at our [place] office. Start at 0.3mL of 1:10,000 dilution (yellow vial) and build up per protocol.  Mild intermittent asthma without complication Normal spirometry today. Please take Airsupra 2 puffs three times a day for the next few days. Sample given. Rinse mouth after each use. Daily controller medication(s): start Breo 100mcg 1 puff once a day and rinse mouth after each use.  May use albuterol  rescue inhaler 2 puffs every 4 to 6 hours as needed for shortness of breath, chest tightness, coughing, and wheezing.  Monitor frequency of use - if you need to use it more than twice per week on a consistent basis let us  know.   Next appointment:  Start at 0.3mL of 1:10,000 dilution (yellow vial) and build up per protocol.  Return in about 2 months (around 02/15/2024).   History of Present Illness: I had the pleasure of seeing Janice Vincent for a follow up visit at the [facility] on 12/16/2023. She is a 68 y.o. female, who is being followed for allergic rhinitis, reactive airway disease, food allergies. Her previous allergy office visit was on 08/19/2023 with Dr. Jeneal. Today she is here for rapid desensitization.   Discussed the use of AI scribe software for clinical note transcription with the patient, who gave verbal consent to proceed.    She is undergoing a rush buildup of her allergy shots today. She has a history of receiving allergy injections in middle school and in her twenties, which provided relief.  This is her third time undergoing allergy shots due to multiple seasonal allergies.  Since mid-July, she has experienced increased asthma symptoms, requiring the use of her albuterol  inhaler. The frequency of use varies, with some days requiring one to two uses and other days none. She does not use a daily inhaler but takes montelukast  once at night. She has a cough with yellowish-green mucus, especially when pollen levels are high, and experiences postnasal drip. No fever, rash, itching, or swelling.  Her current medications include prednisone , montelukast , famotidine , and allergy pills as prescribed. She also takes metoprolol  once daily for a mild case of tachycardia. Her asthma has been doing better this week.     Interval History: Patient has not been ill, she has taken all premedications as per protocol.  Recent/Current History: Pulmonary disease: yes Stable this week Cardiac disease:  tachycardia Respiratory infection: no Rash: no Itch: no Swelling: no Cough: yes Shortness of breath: no Runny/stuffy nose: PND Itchy eyes: no Beta-blocker use: yes  Patient/guardian was informed of the test procedure with verbalized understanding of the risk of anaphylaxis. Consent was signed.   Last beta-blocker use: takes metoprolol  in the mornings 25mg   Medication List:  Current Outpatient Medications  Medication Sig Dispense Refill   famotidine  (PEPCID ) 20 MG tablet Take 1 tablet (20 mg total) by mouth 2 (two) times daily. 60 tablet 0   fluticasone  furoate-vilanterol (BREO ELLIPTA ) 100-25 MCG/ACT AEPB Inhale 1 puff into the lungs daily. Rinse mouth after each use. 60 each 2   albuterol  (VENTOLIN  HFA) 108 (  90 Base) MCG/ACT inhaler INHALE 2 PUFFS INTO THE LUNGS EVERY 4 HOURS AS NEEDED 18 g 1   cetirizine (ZYRTEC) 10 MG tablet Take 10 mg by mouth daily as needed. Seasonal allergies     EPINEPHrine  (EPIPEN  2-PAK) 0.3 mg/0.3 mL IJ SOAJ injection Inject 0.3 mg into the muscle as needed for  anaphylaxis. 0.3 mL 1   fluticasone  (FLONASE ) 50 MCG/ACT nasal spray 2 sprays each nostril daily for 1-2 weeks at a time before stopping once nasal congestion improves. 16 g 5   ipratropium (ATROVENT ) 0.06 % nasal spray 2 sprays per nostril as needed for nasal drainage/ throat clearing control 15 mL 5   metoprolol  succinate (TOPROL -XL) 25 MG 24 hr tablet Take 1 tablet (25 mg total) by mouth daily. 90 tablet 3   montelukast  (SINGULAIR ) 10 MG tablet Take 1 tablet (10 mg total) by mouth at bedtime. 30 tablet 5   No current facility-administered medications for this visit.   Allergies: Allergies  Allergen Reactions   Sulfonamide Derivatives Other (See Comments)    Childhood reaction, unable to recall   I reviewed her past medical history, social history, family history, and environmental history and no significant changes have been reported from her previous visit.  Review of Systems  Constitutional:  Negative for appetite change, chills, fever and unexpected weight change.  HENT:  Negative for congestion and rhinorrhea.   Eyes:  Negative for itching.  Respiratory:  Negative for cough, chest tightness, shortness of breath and wheezing.   Cardiovascular:  Negative for chest pain.  Gastrointestinal:  Negative for abdominal pain.  Genitourinary:  Negative for difficulty urinating.  Skin:  Negative for rash.  Allergic/Immunologic: Positive for environmental allergies and food allergies.  Neurological:  Negative for headaches.   Objective: BP 112/68   Pulse (!) 58   Temp 97.8 F (36.6 C)   Resp 16   Ht 5' 3 (1.6 m)   Wt 148 lb (67.1 kg)   SpO2 96%   BMI 26.22 kg/m  Body mass index is 26.22 kg/m. Physical Exam Vitals and nursing note reviewed.  Constitutional:      Appearance: Normal appearance. She is well-developed.  HENT:     Head: Normocephalic and atraumatic.     Right Ear: Tympanic membrane and external ear normal.     Left Ear: Tympanic membrane and external ear normal.      Nose: Nose normal.     Mouth/Throat:     Mouth: Mucous membranes are moist.     Pharynx: Oropharynx is clear.  Eyes:     Conjunctiva/sclera: Conjunctivae normal.  Cardiovascular:     Rate and Rhythm: Normal rate and regular rhythm.     Heart sounds: Normal heart sounds. No murmur heard.    No friction rub. No gallop.  Pulmonary:     Effort: Pulmonary effort is normal.     Breath sounds: Normal breath sounds. No wheezing, rhonchi or rales.  Musculoskeletal:     Cervical back: Neck supple.  Skin:    General: Skin is warm.     Findings: No rash.  Neurological:     Mental Status: She is alert and oriented to person, place, and time.  Psychiatric:        Behavior: Behavior normal.     Diagnostics: Spirometry:  Tracings reviewed. Her effort: Good reproducible efforts. FVC: 2.81L FEV1: 1.99L, 92% predicted FEV1/FVC ratio: 71% Interpretation: Spirometry consistent with normal pattern.  Please see scanned spirometry results for details.  Results discussed with  patient/family.  PROCEDURES: Patient received the following doses every hour: Step 1:  0.1ml - 1:1,000,000 dilution (silver vial) Step 2:  0.3ml - 1:1,000,000 dilution (silver vial) Step 3: 0.1ml - 1:100,000 dilution (blue vial)  Step 4: 0.3ml - 1:100,000 dilution (blue vial)  Step 5: 0.1ml - 1:10,000 dilution (gold vial) Step 6: 0.2ml - 1:10,000 dilution (gold vial)  Patient was observed for 1 hour after the last dose.   Procedure started at 8:49AM Procedure ended at 2:01PM  Previous notes and tests were reviewed. The plan was reviewed with the patient/family, and all questions/concerned were addressed.  It was my pleasure to see Etter today and participate in her care. Please feel free to contact me with any questions or concerns.  Sincerely,  Orlan Cramp, DO Allergy & Immunology  [facility]  [facility] office: [phone] [facility] office: [phone]

## 2023-12-16 ENCOUNTER — Ambulatory Visit (INDEPENDENT_AMBULATORY_CARE_PROVIDER_SITE_OTHER): Admitting: Allergy

## 2023-12-16 ENCOUNTER — Encounter: Payer: Self-pay | Admitting: Allergy

## 2023-12-16 VITALS — BP 112/68 | HR 58 | Temp 97.8°F | Resp 16 | Ht 63.0 in | Wt 148.0 lb

## 2023-12-16 DIAGNOSIS — J4520 Mild intermittent asthma, uncomplicated: Secondary | ICD-10-CM | POA: Diagnosis not present

## 2023-12-16 DIAGNOSIS — J3089 Other allergic rhinitis: Secondary | ICD-10-CM

## 2023-12-16 MED ORDER — FLUTICASONE FUROATE-VILANTEROL 100-25 MCG/ACT IN AEPB: 1.0000 | INHALATION_SPRAY | Freq: Every day | RESPIRATORY_TRACT | 2 refills | Status: DC

## 2023-12-16 NOTE — Patient Instructions (Addendum)
 Your tolerated rush build up today up to yellow 0.50mL. We had to stop early due to your chest tightness complaint which improved with the rescue inhaler. Please take Airsupra 2 puffs three times a day for the next few days. Sample given. Rinse mouth after each use.  Return in 1 week for next allergy injection at our [place] office. Start at 0.3mL of 1:10,000 dilution (yellow vial) and build up per protocol.  Past skin testing positive to grasses, weeds, trees, indoor molds, outdoor molds, dust mites, and cat. Continue environmental control measures as below. Use over the counter antihistamines such as Zyrtec (cetirizine), Claritin (loratadine), Allegra (fexofenadine), or Xyzal (levocetirizine) daily as needed. May take twice a day during allergy flares. May switch antihistamines every few months. Continue Singulair  (montelukast ) 10mg  daily at night. Use Flonase  (fluticasone ) nasal spray 1-2 sprays per nostril once a day as needed for nasal congestion.  Use Atrovent  (ipratropium) 0.06% 1-2 sprays per nostril four a day as needed for runny nose/drainage. Nasal saline spray (i.e., Simply Saline) or nasal saline lavage (i.e., NeilMed) is recommended as needed and prior to medicated nasal sprays. Continue allergy injections. SKIP if you have asthma symptoms.  Breathing Daily controller medication(s): start Breo 100mcg 1 puff once a day and rinse mouth after each use.  May use albuterol  rescue inhaler 2 puffs every 4 to 6 hours as needed for shortness of breath, chest tightness, coughing, and wheezing.  Monitor frequency of use - if you need to use it more than twice per week on a consistent basis let us  know.  Breathing control goals:  Full participation in all desired activities (may need albuterol  before activity) Albuterol  use two times or less a week on average (not counting use with activity) Cough interfering with sleep two times or less a month Oral steroids no more than once a  year No hospitalizations   Follow up in 2 months or sooner if needed.   Reducing Pollen Exposure Pollen seasons: trees (spring), grass (summer) and ragweed/weeds (fall). Keep windows closed in your home and car to lower pollen exposure.  Install air conditioning in the bedroom and throughout the house if possible.  Avoid going out in dry windy days - especially early morning. Pollen counts are highest between 5 - 10 AM and on dry, hot and windy days.  Save outside activities for late afternoon or after a heavy rain, when pollen levels are lower.  Avoid mowing of grass if you have grass pollen allergy. Be aware that pollen can also be transported indoors on people and pets.  Dry your clothes in an automatic dryer rather than hanging them outside where they might collect pollen.  Rinse hair and eyes before bedtime. Mold Control Mold and fungi can grow on a variety of surfaces provided certain temperature and moisture conditions exist.  Outdoor molds grow on plants, decaying vegetation and soil. The major outdoor mold, Alternaria and Cladosporium, are found in very high numbers during hot and dry conditions. Generally, a late summer - fall peak is seen for common outdoor fungal spores. Rain will temporarily lower outdoor mold spore count, but counts rise rapidly when the rainy period ends. The most important indoor molds are Aspergillus and Penicillium. Dark, humid and poorly ventilated basements are ideal sites for mold growth. The next most common sites of mold growth are the bathroom and the kitchen. Outdoor (Seasonal) Mold Control Use air conditioning and keep windows closed. Avoid exposure to decaying vegetation. Avoid leaf raking. Avoid grain handling.  Consider wearing a face mask if working in moldy areas.  Indoor (Perennial) Mold Control  Maintain humidity below 50%. Get rid of mold growth on hard surfaces with water, detergent and, if necessary, 5% bleach (do not mix with other  cleaners). Then dry the area completely. If mold covers an area more than 10 square feet, consider hiring an indoor environmental professional. For clothing, washing with soap and water is best. If moldy items cannot be cleaned and dried, throw them away. Remove sources e.g. contaminated carpets. Repair and seal leaking roofs or pipes. Using dehumidifiers in damp basements may be helpful, but empty the water and clean units regularly to prevent mildew from forming. All rooms, especially basements, bathrooms and kitchens, require ventilation and cleaning to deter mold and mildew growth. Avoid carpeting on concrete or damp floors, and storing items in damp areas. Control of House Dust Mite Allergen Dust mite allergens are a common trigger of allergy and asthma symptoms. While they can be found throughout the house, these microscopic creatures thrive in warm, humid environments such as bedding, upholstered furniture and carpeting. Because so much time is spent in the bedroom, it is essential to reduce mite levels there.  Encase pillows, mattresses, and box springs in special allergen-proof fabric covers or airtight, zippered plastic covers.  Bedding should be washed weekly in hot water (130 F) and dried in a hot dryer. Allergen-proof covers are available for comforters and pillows that can't be regularly washed.  Wash the allergy-proof covers every few months. Minimize clutter in the bedroom. Keep pets out of the bedroom.  Keep humidity less than 50% by using a dehumidifier or air conditioning. You can buy a humidity measuring device called a hygrometer to monitor this.  If possible, replace carpets with hardwood, linoleum, or washable area rugs. If that's not possible, vacuum frequently with a vacuum that has a HEPA filter. Remove all upholstered furniture and non-washable window drapes from the bedroom. Remove all non-washable stuffed toys from the bedroom.  Wash stuffed toys weekly. Pet Allergen  Avoidance: Contrary to popular opinion, there are no "hypoallergenic" breeds of dogs or cats. That is because people are not allergic to an animal's hair, but to an allergen found in the animal's saliva, dander (dead skin flakes) or urine. Pet allergy symptoms typically occur within minutes. For some people, symptoms can build up and become most severe 8 to 12 hours after contact with the animal. People with severe allergies can experience reactions in public places if dander has been transported on the pet owners' clothing. Keeping an animal outdoors is only a partial solution, since homes with pets in the yard still have higher concentrations of animal allergens. Before getting a pet, ask your allergist to determine if you are allergic to animals. If your pet is already considered part of your family, try to minimize contact and keep the pet out of the bedroom and other rooms where you spend a great deal of time. As with dust mites, vacuum carpets often or replace carpet with a hardwood floor, tile or linoleum. High-efficiency particulate air (HEPA) cleaners can reduce allergen levels over time. While dander and saliva are the source of cat and dog allergens, urine is the source of allergens from rabbits, hamsters, mice and [place]rael pigs; so ask a non-allergic family member to clean the animal's cage. If you have a pet allergy, talk to your allergist about the potential for allergy immunotherapy (allergy shots). This strategy can often provide long-term relief.

## 2023-12-23 ENCOUNTER — Ambulatory Visit

## 2023-12-23 DIAGNOSIS — J309 Allergic rhinitis, unspecified: Secondary | ICD-10-CM

## 2023-12-30 ENCOUNTER — Ambulatory Visit

## 2023-12-30 DIAGNOSIS — J309 Allergic rhinitis, unspecified: Secondary | ICD-10-CM | POA: Diagnosis not present

## 2024-01-06 ENCOUNTER — Ambulatory Visit

## 2024-01-06 DIAGNOSIS — J309 Allergic rhinitis, unspecified: Secondary | ICD-10-CM | POA: Diagnosis not present

## 2024-01-12 ENCOUNTER — Other Ambulatory Visit: Payer: Self-pay | Admitting: Allergy

## 2024-01-13 ENCOUNTER — Ambulatory Visit

## 2024-01-13 DIAGNOSIS — J309 Allergic rhinitis, unspecified: Secondary | ICD-10-CM

## 2024-01-17 ENCOUNTER — Other Ambulatory Visit: Payer: Self-pay | Admitting: Allergy

## 2024-01-20 ENCOUNTER — Ambulatory Visit (INDEPENDENT_AMBULATORY_CARE_PROVIDER_SITE_OTHER)

## 2024-01-20 DIAGNOSIS — J309 Allergic rhinitis, unspecified: Secondary | ICD-10-CM

## 2024-01-27 ENCOUNTER — Ambulatory Visit

## 2024-01-27 DIAGNOSIS — J309 Allergic rhinitis, unspecified: Secondary | ICD-10-CM

## 2024-02-10 ENCOUNTER — Ambulatory Visit (INDEPENDENT_AMBULATORY_CARE_PROVIDER_SITE_OTHER)

## 2024-02-10 DIAGNOSIS — J309 Allergic rhinitis, unspecified: Secondary | ICD-10-CM | POA: Diagnosis not present

## 2024-02-14 ENCOUNTER — Other Ambulatory Visit: Payer: Self-pay | Admitting: Allergy

## 2024-02-17 ENCOUNTER — Ambulatory Visit (INDEPENDENT_AMBULATORY_CARE_PROVIDER_SITE_OTHER)

## 2024-02-17 DIAGNOSIS — J309 Allergic rhinitis, unspecified: Secondary | ICD-10-CM | POA: Diagnosis not present

## 2024-03-02 ENCOUNTER — Other Ambulatory Visit: Payer: Self-pay

## 2024-03-02 ENCOUNTER — Encounter: Payer: Self-pay | Admitting: Allergy

## 2024-03-02 ENCOUNTER — Ambulatory Visit: Admitting: Allergy

## 2024-03-02 VITALS — BP 118/68 | HR 60 | Temp 97.6°F | Resp 18 | Ht 63.0 in | Wt 150.0 lb

## 2024-03-02 DIAGNOSIS — J3089 Other allergic rhinitis: Secondary | ICD-10-CM

## 2024-03-02 DIAGNOSIS — T7819XD Other adverse food reactions, not elsewhere classified, subsequent encounter: Secondary | ICD-10-CM

## 2024-03-02 DIAGNOSIS — J4520 Mild intermittent asthma, uncomplicated: Secondary | ICD-10-CM | POA: Diagnosis not present

## 2024-03-02 DIAGNOSIS — J302 Other seasonal allergic rhinitis: Secondary | ICD-10-CM

## 2024-03-02 DIAGNOSIS — R0982 Postnasal drip: Secondary | ICD-10-CM

## 2024-03-02 MED ORDER — IPRATROPIUM BROMIDE 0.06 % NA SOLN: NASAL | 5 refills | Status: AC

## 2024-03-02 MED ORDER — ALBUTEROL SULFATE HFA 108 (90 BASE) MCG/ACT IN AERS: 2.0000 | INHALATION_SPRAY | Freq: Four times a day (QID) | RESPIRATORY_TRACT | 1 refills | Status: AC | PRN

## 2024-03-02 MED ORDER — FLUTICASONE PROPIONATE 50 MCG/ACT NA SUSP: NASAL | 5 refills | Status: AC

## 2024-03-02 MED ORDER — MONTELUKAST SODIUM 10 MG PO TABS: 10.0000 mg | ORAL_TABLET | Freq: Every day | ORAL | 5 refills | Status: DC

## 2024-03-02 MED ORDER — FAMOTIDINE 20 MG PO TABS: 20.0000 mg | ORAL_TABLET | Freq: Two times a day (BID) | ORAL | 0 refills | Status: DC

## 2024-03-02 MED ORDER — EPINEPHRINE 0.3 MG/0.3ML IJ SOAJ
0.3000 mg | INTRAMUSCULAR | 1 refills | Status: AC | PRN
Start: 2024-03-02 — End: ?

## 2024-03-02 MED ORDER — CETIRIZINE HCL 10 MG PO TABS: 10.0000 mg | ORAL_TABLET | Freq: Every day | ORAL | 3 refills | Status: AC | PRN

## 2024-03-02 NOTE — Progress Notes (Signed)
 Follow-up Note  RE: OVIDA Vincent MRN: [id number] DOB: Feb 15, 1956 Date of Office Visit: 03/02/2024   History of present illness: Janice Vincent is a 68 y.o. female presenting today for follow-up of allergic rhinitis, reactive airway and food allergy.  She was last seen in the office on 08/19/23 by myself.  Discussed the use of AI scribe software for clinical note transcription with the patient, who gave verbal consent to proceed.  She has been undergoing allergy shots since late August and is currently in the buildup phase, attending weekly sessions. She completed the rush phase in August.  Since late August, she has experienced a persistent productive cough accompanied by drainage that she cannot clear. The cough produces sputum that was initially white and cottage cheese-like, transitioning to a light yellow color. The sputum is thick, requiring several coughs to clear. No fever or feeling of sickness, but the cough is described as 'almost strangling.'  She has been using nasal sprays including Flonase  and ipratropium, but finds that while they dry the anterior nasal area, they do not alleviate the postnasal drip. Using Zyrtec, Flonase , and ipratropium together makes her 'super dry.' She is also taking Singulair  (montelukast ) at bedtime and finds it beneficial. She uses Breo 1 puff daily, which she describes as 'amazing right out of the gate.'  She experiences large local reactions at the injection sites, particularly with the pollen vial, which can swell to the size of her hand and last into Sunday evening if she gets her injections on Fridays. The reactions are itchy but not bothersome.  She has a history of tonsil stones, which she occasionally removes herself. The current drainage is not hard, unlike the tonsil stones she has experienced before.  She is not consistent with saline rinses but is open to doing them once a week, preferably at night to clear out mucus and inhalant particles  before sleep.   Review of systems: 10pt ROS negative unless noted above in HPI   Past medical/social/surgical/family history have been reviewed and are unchanged unless specifically indicated below.  No changes  Medication List: Current Outpatient Medications  Medication Sig Dispense Refill   BREO ELLIPTA  100-25 MCG/ACT AEPB INHALE 1 PUFF INTO THE LUNGS DAILY. RINSE MOUTH AFTER USE 60 each 2   metoprolol  succinate (TOPROL -XL) 25 MG 24 hr tablet Take 1 tablet (25 mg total) by mouth daily. 90 tablet 3   albuterol  (VENTOLIN  HFA) 108 (90 Base) MCG/ACT inhaler Inhale 2 puffs into the lungs every 6 (six) hours as needed for wheezing or shortness of breath. 18 g 1   cetirizine (ZYRTEC) 10 MG tablet Take 1 tablet (10 mg total) by mouth daily as needed. Seasonal allergies 30 tablet 3   EPINEPHrine  (EPIPEN  2-PAK) 0.3 mg/0.3 mL IJ SOAJ injection Inject 0.3 mg into the muscle as needed for anaphylaxis. 0.3 mL 1   famotidine  (PEPCID ) 20 MG tablet Take 1 tablet (20 mg total) by mouth 2 (two) times daily. 180 tablet 0   fluticasone  (FLONASE ) 50 MCG/ACT nasal spray INSTILL 2 SPRAYS INTO EACH NOSTRIL DAILY FOR 1-2 WEEKS AT A TIME BEFORE STOPPING ONCE NASAL CONGESTION IMPROVES 16 g 5   ipratropium (ATROVENT ) 0.06 % nasal spray 2 sprays per nostril as needed for nasal drainage/ throat clearing control 15 mL 5   montelukast  (SINGULAIR ) 10 MG tablet Take 1 tablet (10 mg total) by mouth at bedtime. 30 tablet 5   No current facility-administered medications for this visit.     Known  medication allergies: Allergies  Allergen Reactions   Sulfonamide Derivatives Other (See Comments)    Childhood reaction, unable to recall     Physical examination: Blood pressure 118/68, pulse 60, temperature 97.6 F (36.4 C), temperature source Temporal, resp. rate 18, height 5' 3 (1.6 m), weight 150 lb (68 kg), SpO2 96%.  General: Alert, interactive, in no acute distress. HEENT: PERRLA, TMs pearly gray, turbinates  minimally edematous with crusty discharge, post-pharynx non erythematous. Neck: Supple without lymphadenopathy. Lungs: Clear to auscultation without wheezing, rhonchi or rales. {no increased work of breathing. CV: Normal S1, S2 without murmurs. Abdomen: Nondistended, nontender. Skin: Warm and dry, without lesions or rashes. Extremities:  No clubbing, cyanosis or edema. Neuro:   Grossly intact.  Diagnostics/Labs: Immunotherapy injections provided today  Spirometry: FEV1 1.99 L 93%, FVC 2.91 L 105% predicted.  Nonobstructive pattern  Assessment and plan:  Allergic rhinitis Reactive airway Food allergy   - Continue avoidance measures for grasses, weeds, trees, indoor molds, outdoor molds, dust mites, and cat.  - For next week take Zyrtec (cetirizine) 10mg  tablet twice a day.  If increased dosing helps lessen post-nasal drip then continue twice a day dosing until we reach maintenance on allergy shots.   Let me know if helps and can send a prescription for increased dosing.  - Continue Singulair  10mg  daily at bedtime.   - Continue Flonase  2 sprays each nostril daily for 1-2 weeks at a time before stopping once nasal congestion improves for maximum benefit.  - For colds/URIs can use Atrovent  2 sprays per nostril as needed up to 4 times a day for congestion.    Atrovent  unfortunately has not been very effective in controlling post-nasal drip.   - Perform nasal saline rinses 3-7 times a week to remove allergens from the nasal cavities as well as help with mucous clearance (this is especially helpful to do before the nasal sprays are given).  Use your nasal sprays after performing rinse.  - Continue allergy shots per protocol.  You are doing well in build-up.    - Have access to albuterol  inhaler 2 puffs every 4-6 hours as needed for cough/wheeze/shortness of breath/chest tightness.  May use 15-20 minutes prior to activity.   Monitor frequency of use.   - Continue Breo 1 puff daily at this time.   Rinse mouth after use.   - Continue to monitor for symptoms after eating barley or yeast containing foods.  If you are having symptoms with ingestion of these foods then remove from diet.  If you are not having symptoms after eating these foods then you are sensitized only and can keep in diet.   - Due to positive food testing have access to self-injectable Epipen  0.3mg  - Follow emergency action plan in case of allergic reaction    Allergy: food allergy is when you have eaten a food, developed an allergic reaction after eating the food and have IgE to the food (positive food testing either by skin testing or blood testing).  Food allergy could lead to life threatening symptoms  Sensitivity: occurs when you have IgE to a food (positive food testing either by skin testing or blood testing) but is a food you eat without any issues.  This is not an allergy and we recommend keeping the food in the diet  Intolerance: this is when you have negative testing by either skin testing or blood testing thus not allergic but the food causes symptoms (like belly pain, bloating, diarrhea etc) with ingestion.  These foods should be avoided to prevent symptoms.    Follow-up in 6 months or sooner if needed I appreciate the opportunity to take part in Evalyne's care. Please do not hesitate to contact me with questions.  Sincerely,   Danita Brain, MD Allergy/Immunology [facility]

## 2024-03-02 NOTE — Patient Instructions (Addendum)
-   Continue avoidance measures for grasses, weeds, trees, indoor molds, outdoor molds, dust mites, and cat.  - For next week take Zyrtec (cetirizine) 10mg  tablet twice a day.  If increased dosing helps lessen post-nasal drip then continue twice a day dosing until we reach maintenance on allergy shots.   Let me know if helps and can send a prescription for increased dosing.  - Continue Singulair  10mg  daily at bedtime.   - Continue Flonase  2 sprays each nostril daily for 1-2 weeks at a time before stopping once nasal congestion improves for maximum benefit.  - For colds/URIs can use Atrovent  2 sprays per nostril as needed up to 4 times a day for congestion.    Atrovent  unfortunately has not been very effective in controlling post-nasal drip.   - Perform nasal saline rinses 3-7 times a week to remove allergens from the nasal cavities as well as help with mucous clearance (this is especially helpful to do before the nasal sprays are given).  Use your nasal sprays after performing rinse.  - Continue allergy shots per protocol.  You are doing well in build-up.    - Have access to albuterol  inhaler 2 puffs every 4-6 hours as needed for cough/wheeze/shortness of breath/chest tightness.  May use 15-20 minutes prior to activity.   Monitor frequency of use.   - Continue Breo 1 puff daily at this time.  Rinse mouth after use.   - Continue to monitor for symptoms after eating barley or yeast containing foods.  If you are having symptoms with ingestion of these foods then remove from diet.  If you are not having symptoms after eating these foods then you are sensitized only and can keep in diet.   - Due to positive food testing have access to self-injectable Epipen  0.3mg  - Follow emergency action plan in case of allergic reaction    Allergy: food allergy is when you have eaten a food, developed an allergic reaction after eating the food and have IgE to the food (positive food testing either by skin testing or blood  testing).  Food allergy could lead to life threatening symptoms  Sensitivity: occurs when you have IgE to a food (positive food testing either by skin testing or blood testing) but is a food you eat without any issues.  This is not an allergy and we recommend keeping the food in the diet  Intolerance: this is when you have negative testing by either skin testing or blood testing thus not allergic but the food causes symptoms (like belly pain, bloating, diarrhea etc) with ingestion.  These foods should be avoided to prevent symptoms.    Follow-up in 6 months or sooner if needed

## 2024-03-09 ENCOUNTER — Ambulatory Visit (INDEPENDENT_AMBULATORY_CARE_PROVIDER_SITE_OTHER)

## 2024-03-09 DIAGNOSIS — J309 Allergic rhinitis, unspecified: Secondary | ICD-10-CM

## 2024-03-19 ENCOUNTER — Other Ambulatory Visit: Payer: Self-pay | Admitting: Allergy

## 2024-03-19 DIAGNOSIS — J3089 Other allergic rhinitis: Secondary | ICD-10-CM

## 2024-03-22 ENCOUNTER — Ambulatory Visit

## 2024-03-22 DIAGNOSIS — J309 Allergic rhinitis, unspecified: Secondary | ICD-10-CM

## 2024-03-22 DIAGNOSIS — J302 Other seasonal allergic rhinitis: Secondary | ICD-10-CM

## 2024-03-30 ENCOUNTER — Ambulatory Visit

## 2024-03-30 DIAGNOSIS — J302 Other seasonal allergic rhinitis: Secondary | ICD-10-CM | POA: Diagnosis not present

## 2024-03-30 DIAGNOSIS — J309 Allergic rhinitis, unspecified: Secondary | ICD-10-CM

## 2024-04-06 ENCOUNTER — Ambulatory Visit (INDEPENDENT_AMBULATORY_CARE_PROVIDER_SITE_OTHER)

## 2024-04-06 DIAGNOSIS — J302 Other seasonal allergic rhinitis: Secondary | ICD-10-CM

## 2024-04-06 DIAGNOSIS — J309 Allergic rhinitis, unspecified: Secondary | ICD-10-CM

## 2024-04-20 ENCOUNTER — Ambulatory Visit

## 2024-04-20 DIAGNOSIS — J302 Other seasonal allergic rhinitis: Secondary | ICD-10-CM

## 2024-04-20 DIAGNOSIS — J309 Allergic rhinitis, unspecified: Secondary | ICD-10-CM

## 2024-04-26 ENCOUNTER — Encounter: Payer: Self-pay | Admitting: Cardiology

## 2024-04-27 ENCOUNTER — Ambulatory Visit

## 2024-04-27 DIAGNOSIS — J302 Other seasonal allergic rhinitis: Secondary | ICD-10-CM | POA: Diagnosis not present

## 2024-05-04 ENCOUNTER — Ambulatory Visit (INDEPENDENT_AMBULATORY_CARE_PROVIDER_SITE_OTHER)

## 2024-05-04 DIAGNOSIS — J302 Other seasonal allergic rhinitis: Secondary | ICD-10-CM

## 2024-05-07 NOTE — Assessment & Plan Note (Signed)
Stable.  Continue metoprolol succinate 25 mg daily. 

## 2024-05-07 NOTE — Assessment & Plan Note (Signed)
 I will reassess lipids today.

## 2024-05-07 NOTE — Assessment & Plan Note (Signed)
 I will reassess thyroid tests today, as she has had elevated TSH levels in the past.

## 2024-05-07 NOTE — Progress Notes (Signed)
 " [facility] [address] [place] [address] Dept: [phone] Dept Fax: [phone]  Annual Physical Visit  Subjective:    Patient ID: Janice Vincent, female    DOB: [birth year], 69 y.o..   MRN: [id number]  No chief complaint on file.  History of Present Illness:  Patient is in today for an annual physical/preventative visit.  Past Medical History: Patient Active Problem List   Diagnosis Date Noted   Borderline hyperlipidemia 05/03/2022   PAT (paroxysmal atrial tachycardia) 03/24/2021   Hoarseness 02/25/2014   Allergic rhinitis 03/27/2009   History of colon polyps 01/31/2008   Diverticulosis of colon 01/08/2008   Multinodular goiter 12/29/2007   Mild intermittent asthma 02/18/2007   Irritable bowel syndrome with diarrhea 02/18/2007   Past Surgical History:  Procedure Laterality Date   breast biospy  04/20/1979   benign   COLONOSCOPY     growth removed from left foot 5th digit  04/19/1998   SKIN LESION EXCISION     multiple moles   Family History  Adopted: Yes  Problem Relation Age of Onset   Alcoholism Mother    Alcohol abuse Mother    Heart failure Father        now age 71   Macular degeneration Father    Celiac disease Father    Dementia Father    Vision loss Father    Alcohol abuse Maternal Grandmother    Atrial fibrillation Half-Brother    Colon polyps Neg Hx    Colon cancer Neg Hx    Esophageal cancer Neg Hx    Rectal cancer Neg Hx    Stomach cancer Neg Hx    Outpatient Medications Prior to Visit  Medication Sig Dispense Refill   albuterol  (VENTOLIN  HFA) 108 (90 Base) MCG/ACT inhaler Inhale 2 puffs into the lungs every 6 (six) hours as needed for wheezing or shortness of breath. 18 g 1   BREO ELLIPTA  100-25 MCG/ACT AEPB INHALE 1 PUFF INTO THE LUNGS DAILY. RINSE MOUTH AFTER USE 60 each 2   cetirizine  (ZYRTEC ) 10 MG tablet Take 1 tablet (10 mg total) by mouth daily as needed. Seasonal allergies  30 tablet 3   EPINEPHrine  (EPIPEN  2-PAK) 0.3 mg/0.3 mL IJ SOAJ injection Inject 0.3 mg into the muscle as needed for anaphylaxis. 0.3 mL 1   famotidine  (PEPCID ) 20 MG tablet Take 1 tablet (20 mg total) by mouth 2 (two) times daily. 180 tablet 0   fluticasone  (FLONASE ) 50 MCG/ACT nasal spray INSTILL 2 SPRAYS INTO EACH NOSTRIL DAILY FOR 1-2 WEEKS AT A TIME BEFORE STOPPING ONCE NASAL CONGESTION IMPROVES 16 g 5   ipratropium (ATROVENT ) 0.06 % nasal spray 2 sprays per nostril as needed for nasal drainage/ throat clearing control 15 mL 5   metoprolol  succinate (TOPROL -XL) 25 MG 24 hr tablet Take 1 tablet (25 mg total) by mouth daily. 90 tablet 3   montelukast  (SINGULAIR ) 10 MG tablet TAKE 1 TABLET(10 MG) BY MOUTH AT BEDTIME 30 tablet 5   No facility-administered medications prior to visit.   Allergies[1] Objective:   There were no vitals filed for this visit. There is no height or weight on file to calculate BMI.   General: Well developed, well nourished. No acute distress. HEENT: Normocephalic, non-traumatic. PERRL, EOMI. Conjunctiva clear. External ears normal. EAC and TMs normal bilaterally. Nose    clear without congestion or rhinorrhea. Mucous membranes moist. Oropharynx clear. Good dentition. Neck: Supple. No lymphadenopathy. No thyromegaly. Lungs: Clear to auscultation bilaterally.  No wheezing, rales or rhonchi. CV: RRR without murmurs or rubs. Pulses 2+ bilaterally. Abdomen: Soft, non-tender. Bowel sounds positive, normal pitch and frequency. No hepatosplenomegaly. No rebound or guarding. Back: Straight. No CVA tenderness bilaterally. Extremities: Full ROM. No joint swelling or tenderness. No edema noted. Skin: Warm and dry. No rashes. Neuro: CN II-XII intact. Normal sensation and DTR bilaterally. Psych: Alert and oriented. Normal mood and affect.  Health Maintenance Due  Topic Date Due   Mammogram  11/05/2023   Influenza Vaccine  11/18/2023   COVID-19 Vaccine (7 - 2025-26 season)  12/19/2023   Lab Results {Labs (Optional):29002}    Lab Results  Component Value Date   TSH 3.47 05/06/2023   Lipid Panel:  Lab Results  Component Value Date   CHOL 203 (H) 05/06/2023   HDL 56.00 05/06/2023   LDLCALC 130 (H) 05/06/2023   TRIG 84.0 05/06/2023   Assessment & Plan:   Problem List Items Addressed This Visit       Active Problems   Multinodular goiter   I will reassess thyroid  tests today, as she has had elevated TSH levels in the past.      Allergic rhinitis   Working with Allergy. Continue cetirizine  and daily nasal sprays.      PAT (paroxysmal atrial tachycardia)   Stable. Continue metoprolol  succinate 25 mg daily.      Borderline hyperlipidemia   I will reassess lipids today.      Other Visit Diagnoses       Annual physical exam    -  Primary       No follow-ups on file.   Janice CHRISTELLA Simpler, MD  I,Emily Lagle,acting as a scribe for Janice CHRISTELLA Simpler, MD.,have documented all relevant documentation on the behalf of Janice CHRISTELLA Simpler, MD.  I, Janice CHRISTELLA Simpler, MD, have reviewed all documentation for this visit. The documentation on 05/08/2024 for the exam, diagnosis, procedures, and orders are all accurate and complete.      [1]  Allergies Allergen Reactions   Sulfonamide Derivatives Other (See Comments)    Childhood reaction, unable to recall   "

## 2024-05-07 NOTE — Assessment & Plan Note (Signed)
 Working with Allergy. Continue cetirizine and daily nasal sprays.

## 2024-05-08 ENCOUNTER — Ambulatory Visit: Payer: Self-pay | Admitting: Family Medicine

## 2024-05-08 ENCOUNTER — Encounter: Payer: Self-pay | Admitting: Family Medicine

## 2024-05-08 ENCOUNTER — Ambulatory Visit (INDEPENDENT_AMBULATORY_CARE_PROVIDER_SITE_OTHER): Payer: PPO | Admitting: Family Medicine

## 2024-05-08 VITALS — BP 118/66 | HR 74 | Temp 97.7°F | Ht 63.0 in | Wt 151.0 lb

## 2024-05-08 DIAGNOSIS — N951 Menopausal and female climacteric states: Secondary | ICD-10-CM

## 2024-05-08 DIAGNOSIS — Z13820 Encounter for screening for osteoporosis: Secondary | ICD-10-CM

## 2024-05-08 DIAGNOSIS — Z0001 Encounter for general adult medical examination with abnormal findings: Secondary | ICD-10-CM

## 2024-05-08 DIAGNOSIS — Z780 Asymptomatic menopausal state: Secondary | ICD-10-CM

## 2024-05-08 DIAGNOSIS — I4719 Other supraventricular tachycardia: Secondary | ICD-10-CM | POA: Diagnosis not present

## 2024-05-08 DIAGNOSIS — E785 Hyperlipidemia, unspecified: Secondary | ICD-10-CM

## 2024-05-08 DIAGNOSIS — J3089 Other allergic rhinitis: Secondary | ICD-10-CM

## 2024-05-08 DIAGNOSIS — E042 Nontoxic multinodular goiter: Secondary | ICD-10-CM

## 2024-05-08 DIAGNOSIS — Z0000 Encounter for general adult medical examination without abnormal findings: Secondary | ICD-10-CM

## 2024-05-08 LAB — LIPID PANEL
Cholesterol: 191 mg/dL (ref 28–200)
HDL: 54.9 mg/dL
LDL Cholesterol: 118 mg/dL — ABNORMAL HIGH (ref 10–99)
NonHDL: 135.81
Total CHOL/HDL Ratio: 3
Triglycerides: 89 mg/dL (ref 10.0–149.0)
VLDL: 17.8 mg/dL (ref 0.0–40.0)

## 2024-05-08 MED ORDER — ESTRADIOL 0.01 % VA CREA: 1.0000 | TOPICAL_CREAM | VAGINAL | 12 refills | Status: AC

## 2024-05-08 NOTE — Assessment & Plan Note (Signed)
 Overall health is very good. Recommend starting a regular exercise routine. Discussed recommended screenings and immunizations. Has had mammogram at [facility]. MA will obtain result.

## 2024-05-08 NOTE — Assessment & Plan Note (Signed)
 Recommend a trial of vaginal estrogen cream to reduce symptoms. Will prescribe estradiol  0.01% cream to use twice weekly.

## 2024-05-11 ENCOUNTER — Ambulatory Visit (INDEPENDENT_AMBULATORY_CARE_PROVIDER_SITE_OTHER): Admitting: *Deleted

## 2024-05-11 DIAGNOSIS — J302 Other seasonal allergic rhinitis: Secondary | ICD-10-CM

## 2024-05-18 ENCOUNTER — Ambulatory Visit

## 2024-05-18 DIAGNOSIS — J302 Other seasonal allergic rhinitis: Secondary | ICD-10-CM | POA: Diagnosis not present

## 2024-05-25 ENCOUNTER — Ambulatory Visit: Admitting: *Deleted

## 2024-05-25 DIAGNOSIS — J302 Other seasonal allergic rhinitis: Secondary | ICD-10-CM

## 2024-08-17 ENCOUNTER — Ambulatory Visit

## 2024-08-30 ENCOUNTER — Ambulatory Visit: Admitting: Allergy

## 2025-05-09 ENCOUNTER — Encounter: Admitting: Family Medicine
